# Patient Record
Sex: Female | Born: 1950
Health system: Southern US, Community
[De-identification: ages and names within clinical notes are randomized; demographics above are authoritative.]

## PROBLEM LIST (undated history)

## (undated) DIAGNOSIS — I10 Essential (primary) hypertension: Secondary | ICD-10-CM

## (undated) DIAGNOSIS — Z9889 Other specified postprocedural states: Secondary | ICD-10-CM

## (undated) DIAGNOSIS — T4145XA Adverse effect of unspecified anesthetic, initial encounter: Secondary | ICD-10-CM

## (undated) DIAGNOSIS — M8580 Other specified disorders of bone density and structure, unspecified site: Secondary | ICD-10-CM

## (undated) DIAGNOSIS — R200 Anesthesia of skin: Secondary | ICD-10-CM

## (undated) DIAGNOSIS — R202 Paresthesia of skin: Secondary | ICD-10-CM

## (undated) DIAGNOSIS — M1990 Unspecified osteoarthritis, unspecified site: Secondary | ICD-10-CM

## (undated) DIAGNOSIS — T7840XA Allergy, unspecified, initial encounter: Secondary | ICD-10-CM

## (undated) DIAGNOSIS — C801 Malignant (primary) neoplasm, unspecified: Secondary | ICD-10-CM

## (undated) DIAGNOSIS — R112 Nausea with vomiting, unspecified: Secondary | ICD-10-CM

## (undated) HISTORY — PX: KNEE ARTHROSCOPY: SUR90

## (undated) HISTORY — DX: Other specified disorders of bone density and structure, unspecified site: M85.80

## (undated) HISTORY — DX: Anesthesia of skin: R20.0

## (undated) HISTORY — DX: Paresthesia of skin: R20.2

## (undated) HISTORY — DX: Allergy, unspecified, initial encounter: T78.40XA

## (undated) HISTORY — PX: CHOLECYSTECTOMY: SHX55

---

## 1973-09-07 HISTORY — PX: TUBAL LIGATION: SHX77

## 1978-09-07 HISTORY — PX: APPENDECTOMY: SHX54

## 1981-09-07 HISTORY — PX: KNEE SURGERY: SHX244

## 2004-09-07 HISTORY — PX: HAND SURGERY: SHX662

## 2004-11-20 ENCOUNTER — Ambulatory Visit (HOSPITAL_COMMUNITY): Admission: RE | Admit: 2004-11-20 | Discharge: 2004-11-20 | Payer: Self-pay | Admitting: Unknown Physician Specialty

## 2006-03-18 ENCOUNTER — Ambulatory Visit (HOSPITAL_COMMUNITY): Admission: RE | Admit: 2006-03-18 | Discharge: 2006-03-18 | Payer: Self-pay | Admitting: Orthopedic Surgery

## 2008-09-07 HISTORY — PX: KNEE ARTHROSCOPY: SUR90

## 2009-01-25 ENCOUNTER — Ambulatory Visit (HOSPITAL_COMMUNITY): Admission: RE | Admit: 2009-01-25 | Discharge: 2009-01-25 | Payer: Self-pay | Admitting: Family Medicine

## 2013-09-07 DIAGNOSIS — T8859XA Other complications of anesthesia, initial encounter: Secondary | ICD-10-CM

## 2013-09-07 HISTORY — DX: Other complications of anesthesia, initial encounter: T88.59XA

## 2013-09-12 ENCOUNTER — Encounter (HOSPITAL_COMMUNITY): Payer: Self-pay

## 2013-09-13 ENCOUNTER — Encounter (HOSPITAL_COMMUNITY)
Admission: RE | Admit: 2013-09-13 | Discharge: 2013-09-13 | Disposition: A | Discharge status: Home / Self Care | Payer: BC Managed Care – PPO | Source: Ambulatory Visit | Attending: Orthopaedic Surgery | Admitting: Orthopaedic Surgery

## 2013-09-13 ENCOUNTER — Ambulatory Visit (HOSPITAL_COMMUNITY)
Admission: RE | Admit: 2013-09-13 | Discharge: 2013-09-13 | Disposition: A | Discharge status: Home / Self Care | Payer: BC Managed Care – PPO | Source: Ambulatory Visit | Attending: Orthopedic Surgery | Admitting: Orthopedic Surgery

## 2013-09-13 ENCOUNTER — Encounter (HOSPITAL_COMMUNITY): Payer: Self-pay

## 2013-09-13 ENCOUNTER — Other Ambulatory Visit (HOSPITAL_COMMUNITY): Payer: Self-pay

## 2013-09-13 ENCOUNTER — Other Ambulatory Visit (HOSPITAL_COMMUNITY): Payer: Self-pay | Admitting: *Deleted

## 2013-09-13 DIAGNOSIS — E669 Obesity, unspecified: Secondary | ICD-10-CM | POA: Insufficient documentation

## 2013-09-13 DIAGNOSIS — Z87891 Personal history of nicotine dependence: Secondary | ICD-10-CM | POA: Insufficient documentation

## 2013-09-13 DIAGNOSIS — Z01818 Encounter for other preprocedural examination: Secondary | ICD-10-CM | POA: Insufficient documentation

## 2013-09-13 HISTORY — DX: Unspecified osteoarthritis, unspecified site: M19.90

## 2013-09-13 HISTORY — DX: Malignant (primary) neoplasm, unspecified: C80.1

## 2013-09-13 HISTORY — DX: Other specified postprocedural states: Z98.890

## 2013-09-13 HISTORY — DX: Nausea with vomiting, unspecified: R11.2

## 2013-09-13 LAB — COMPREHENSIVE METABOLIC PANEL
ALK PHOS: 89 U/L (ref 39–117)
ALT: 55 U/L — AB (ref 0–35)
AST: 33 U/L (ref 0–37)
Albumin: 4 g/dL (ref 3.5–5.2)
BILIRUBIN TOTAL: 0.3 mg/dL (ref 0.3–1.2)
BUN: 19 mg/dL (ref 6–23)
CHLORIDE: 105 meq/L (ref 96–112)
CO2: 27 mEq/L (ref 19–32)
Calcium: 9.6 mg/dL (ref 8.4–10.5)
Creatinine, Ser: 0.69 mg/dL (ref 0.50–1.10)
GFR calc non Af Amer: >90 mL/min (ref >90)
Glucose, Bld: 92 mg/dL (ref 70–99)
Potassium: 4.7 mEq/L (ref 3.7–5.3)
SODIUM: 144 meq/L (ref 137–147)
TOTAL PROTEIN: 8 g/dL (ref 6.0–8.3)

## 2013-09-13 LAB — URINALYSIS, ROUTINE W REFLEX MICROSCOPIC
Bilirubin Urine: NEGATIVE
Glucose, UA: NEGATIVE mg/dL
Ketones, ur: 15 mg/dL — AB
Nitrite: NEGATIVE
Protein, ur: NEGATIVE mg/dL
SPECIFIC GRAVITY, URINE: 1.029 (ref 1.005–1.030)
Urobilinogen, UA: 0.2 mg/dL (ref 0.0–1.0)
pH: 5.5 (ref 5.0–8.0)

## 2013-09-13 LAB — CBC WITH DIFFERENTIAL/PLATELET
Basophils Absolute: 0 10*3/uL (ref 0.0–0.1)
Basophils Relative: 1 % (ref 0–1)
Eosinophils Absolute: 0.1 10*3/uL (ref 0.0–0.7)
Eosinophils Relative: 2 % (ref 0–5)
HCT: 37.2 % (ref 36.0–46.0)
HEMOGLOBIN: 13.5 g/dL (ref 12.0–15.0)
LYMPHS PCT: 44 % (ref 12–46)
Lymphs Abs: 3.6 10*3/uL (ref 0.7–4.0)
MCH: 32.8 pg (ref 26.0–34.0)
MCHC: 36.3 g/dL — ABNORMAL HIGH (ref 30.0–36.0)
MCV: 90.5 fL (ref 78.0–100.0)
Monocytes Absolute: 0.7 10*3/uL (ref 0.1–1.0)
Monocytes Relative: 9 % (ref 3–12)
NEUTROS PCT: 46 % (ref 43–77)
Neutro Abs: 3.8 10*3/uL (ref 1.7–7.7)
Platelets: 236 10*3/uL (ref 150–400)
RBC: 4.11 MIL/uL (ref 3.87–5.11)
RDW: 13.3 % (ref 11.5–15.5)
WBC: 8.3 10*3/uL (ref 4.0–10.5)

## 2013-09-13 LAB — URINE MICROSCOPIC-ADD ON

## 2013-09-13 LAB — TYPE AND SCREEN
ABO/RH(D): O POS
ANTIBODY SCREEN: NEGATIVE

## 2013-09-13 LAB — APTT: aPTT: 31 seconds (ref 24–37)

## 2013-09-13 LAB — SURGICAL PCR SCREEN
MRSA, PCR: NEGATIVE
Staphylococcus aureus: NEGATIVE

## 2013-09-13 LAB — PROTIME-INR
INR: 1.03 (ref 0.00–1.49)
Prothrombin Time: 13.3 seconds (ref 11.6–15.2)

## 2013-09-13 LAB — ABO/RH: ABO/RH(D): O POS

## 2013-09-13 NOTE — Pre-Procedure Instructions (Signed)
[name]  09/13/2013   Your procedure is scheduled on:  09/19/13  Report to [facility] short stay admitting at 530 AM.  Call this number if you have problems the morning of surgery: [phone]   Remember:   Do not eat food or drink liquids after midnight.   Take these medicines the morning of surgery with A SIP OF WATER: none   Do not wear jewelry, make-up or nail polish.  Do not wear lotions, powders, or perfumes. You may wear deodorant.  Do not shave 48 hours prior to surgery. Men may shave face and neck.  Do not bring valuables to the hospital.  [facility] is not responsible                  for any belongings or valuables.               Contacts, dentures or bridgework may not be worn into surgery.  Leave suitcase in the car. After surgery it may be brought to your room.  For patients admitted to the hospital, discharge time is determined by your                treatment team.               Patients discharged the day of surgery will not be allowed to drive  home.  Name and phone number of your driver:   Special Instructions: Incentive Spirometry - Practice and bring it with you on the day of surgery. Shower using CHG 2 nights before surgery and the night before surgery.  If you shower the day of surgery use CHG.  Use special wash - you have one bottle of CHG for all showers.  You should use approximately 1/3 of the bottle for each shower.   Please read over the following fact sheets that you were given: Pain Booklet, Coughing and Deep Breathing, Blood Transfusion Information, MRSA Information and Surgical Site Infection Prevention

## 2013-09-13 NOTE — Progress Notes (Signed)
req'd notes,ekg from dr terry daniel [facility]

## 2013-09-14 ENCOUNTER — Encounter (HOSPITAL_COMMUNITY): Payer: Self-pay

## 2013-09-14 NOTE — Progress Notes (Addendum)
Anesthesia Chart Review:  Patient is a 63 year old female scheduled for left TKA on 09/19/13 by Dr. [name].  History includes former smoker, obesity, arthritis, melanoma, appendectomy, post-operative N/V.  PCP is Dr. Gar Ponto at [facility] in [place].    EKG on 09/13/13 showed NSR, ST/T wave abnormality, consider lateral ischemia. No previous EKG at her PCP office or at [facility].   CXR on 09/13/13 showed no active cardiopulmonary disease.  Preoperative labs noted.  Urine culture showed > 100,000 gram negative rods.  I reviewed currently available EKG with anesthesiologist Dr. Tamala Julian.  Patient with obesity, otherwise no reported HTN, CAD/MI/CHF, or DM.  If no comparison EKG is found and otherwise she is asymptomatic from a CV standpoint then he anticipates that she can proceed as planned.  I have left a message for patient to call me to clinically correlate.  In the meantime, I notified Dr. Rudene Anda PA Aaron Edelman regarding EKG and urine culture findings.  He saw her for her H&P, and she denied any CV symptoms at that time.  He did note that she does have a family history of CAD, so he is going to call Dr. Quillian Quince to discuss if feels patient will need further evaluation preoperatively.  I will fax preoperative results to [facility].  George Hugh [facility]/Anesthesiology Phone [phone] 09/15/2013 11:56 AM  Addendum: 09/18/13 1:30 PM I spoke with Aaron Edelman for update.  He received a message from Dr. Einar Gip stating that patient had a low risk stress test today.  I'll request records from his office.

## 2013-09-15 NOTE — H&P (Signed)
CHIEF COMPLAINT:  Painful left knee.   HISTORY OF PRESENT ILLNESS:  [name] is a very pleasant 63 year old white female who is seen today for evaluation of her left knee.  She has had an extended history of problems with the left knee dating back into the early 1980s.  She has had 3 surgeries on the left knee, one in 1982, that of an arthroscopy, one in 1983 for ligamentous repair probably of the patellofemoral joint and 1984 when she had a knee scope on the left for debridement.  She has had multiple injections over the years.  She has had recent cortisone injections, which make the knee feel just a little bit better, but she is now to the point where she would like to consider further evaluation for total joint replacements.  She is now having pain with every step and pain at night.  She has pain with her activities of daily living.  She has decreased range of motion and inability to fully extend the knee is also noted.  She is seen today for evaluation.     PAST SURGICAL HISTORY:   1.  Surgery in 1975 for tubal ligation.  2.  Surgery in 1980 for appendectomy. 3.  Surgery in 1982, 1983 and 1984 left knee surgery.  4.  Surgery in 2001 for removal of foreign object from the hand. 5.  Surgery in 2010 for right knee scope.    CURRENT MEDICATIONS:  1.  Advil 200 mg 3 tablets in the morning.  2.  Benadryl 25 mg as needed. 3.  Allegra 180 mg as needed. 4.  Tretinoin 0.05 cream daily to the face for eczema.     ALLERGIES:  Percocet, Vicodin and Darvocet, which cause her sickness to her stomach and anxiety.  She also has some difficulty with anesthesia.     REVIEW OF SYSTEMS:  A 14 point review of systems is negative except for melanoma in 2006 for the lower back.  No recurrence.  She also has chronic hematuria and has been evaluated by a urology.     FAMILY HISTORY:  Positive for a mother who died at age 60 from heart disease and her father who died at age 22 from lung cancer.  She has a brother who  died at age 34 from congestive heart failure, but there is one that is living, but does have diabetes.  Sisters, she has two die, one at age 45 from cardiac issues and one at 14 from lung cancer.  She has 2 living, ages 72 and 42.     SOCIAL HISTORY:  She smoked 2 packs of cigarettes per day for 24 years and quit in 1994.  She does drink an occasional glass of wine.     PHYSICAL EXAMINATION:  Today reveals a 63 year old white female, well developed, well nourished, alert, cooperative in moderate distress secondary to left knee pain.  Height is 5 feet and weight is 201 pounds.  BMI is 39.3.   Vital signs:  Temperature 96.9, pulse 78, respiration 12, blood pressure 149/76.   Head is normocephalic.  Eyes:  Pupils equal, round and reactive to light and accommodation with extraocular movements are intact.  Ears, nose and throat were benign.   The neck was supple and no bruits were noted.   Chest had good expansion.  Lungs were essentially clear.  Cardiac:  She had a regular rhythm and rate.  Normal S1 and S2.  No discrete murmurs, rubs or gallops appreciated.  Pulses were  1+ bilateral and symmetric in the lower extremities.  Abdomen was scaphoid and soft, nontender.  No masses palpable.  Normal bowel sounds present.   CNS:  She is oriented x3 and cranial nerves II through XII grossly intact.   Musculoskeletal:  She lacks about 3 degrees to 5 degrees of full extension.  Further flexion to about 95 degrees.  She does have some pseudolaxity with varus and valgus stressing.  She is otherwise ligamentously stable.  Neurovascularly intact distally.     CLINICAL IMPRESSION:  End-stage OA documented previously of the left knee.    RECOMMENDATIONS:  At this time we will plan on proceeding with a left total knee arthroplasty.  The procedure, risks and benefits were fully explained and she is understanding and would like to proceed.    Mike Craze [place], [place] [phone]  09/15/2013 12:16 PM

## 2013-09-16 LAB — URINE CULTURE

## 2013-09-18 MED ORDER — DEXTROSE 5 % IV SOLN
3.0000 g | INTRAVENOUS | Status: AC
  Administered 2013-09-19: 3 g via INTRAVENOUS
  Filled 2013-09-18 (×2): qty 3000

## 2013-09-18 MED ORDER — CHLORHEXIDINE GLUCONATE 4 % EX LIQD: 60.0000 mL | Freq: Every day | CUTANEOUS | Status: DC

## 2013-09-18 MED ORDER — ACETAMINOPHEN 500 MG PO TABS
1000.0000 mg | ORAL_TABLET | Freq: Once | ORAL | Status: AC
  Administered 2013-09-19: 1000 mg via ORAL

## 2013-09-18 MED ORDER — CHLORHEXIDINE GLUCONATE 4 % EX LIQD: 60.0000 mL | Freq: Once | CUTANEOUS | Status: DC

## 2013-09-19 ENCOUNTER — Inpatient Hospital Stay (HOSPITAL_COMMUNITY)
Admission: RE | Admit: 2013-09-19 | Discharge: 2013-09-21 | DRG: 470 | Disposition: A | Discharge status: Home Health | Payer: BC Managed Care – PPO | Source: Ambulatory Visit | Attending: Orthopaedic Surgery | Admitting: Orthopaedic Surgery

## 2013-09-19 ENCOUNTER — Inpatient Hospital Stay (HOSPITAL_COMMUNITY): Payer: BC Managed Care – PPO | Admitting: Anesthesiology

## 2013-09-19 ENCOUNTER — Encounter (HOSPITAL_COMMUNITY): Payer: BC Managed Care – PPO | Admitting: Vascular Surgery

## 2013-09-19 ENCOUNTER — Encounter (HOSPITAL_COMMUNITY): Payer: Self-pay | Admitting: *Deleted

## 2013-09-19 ENCOUNTER — Encounter (HOSPITAL_COMMUNITY)
Admission: RE | Disposition: A | Discharge status: Home Health | Payer: BC Managed Care – PPO | Source: Ambulatory Visit | Attending: Orthopaedic Surgery

## 2013-09-19 DIAGNOSIS — E669 Obesity, unspecified: Secondary | ICD-10-CM | POA: Diagnosis present

## 2013-09-19 DIAGNOSIS — Z96659 Presence of unspecified artificial knee joint: Secondary | ICD-10-CM

## 2013-09-19 DIAGNOSIS — Z87891 Personal history of nicotine dependence: Secondary | ICD-10-CM

## 2013-09-19 DIAGNOSIS — M1712 Unilateral primary osteoarthritis, left knee: Secondary | ICD-10-CM

## 2013-09-19 DIAGNOSIS — M1710 Unilateral primary osteoarthritis, unspecified knee: Principal | ICD-10-CM | POA: Diagnosis present

## 2013-09-19 DIAGNOSIS — M659 Unspecified synovitis and tenosynovitis, unspecified site: Secondary | ICD-10-CM | POA: Diagnosis present

## 2013-09-19 DIAGNOSIS — Z6839 Body mass index (BMI) 39.0-39.9, adult: Secondary | ICD-10-CM

## 2013-09-19 HISTORY — PX: TOTAL KNEE ARTHROPLASTY: SHX125

## 2013-09-19 SURGERY — ARTHROPLASTY, KNEE, TOTAL
Anesthesia: Regional | Site: Knee | Laterality: Left

## 2013-09-19 MED ORDER — FENTANYL CITRATE 0.05 MG/ML IJ SOLN
INTRAMUSCULAR | Status: DC | PRN
  Administered 2013-09-19 (×5): 50 ug via INTRAVENOUS

## 2013-09-19 MED ORDER — OXYCODONE HCL 5 MG PO TABS
ORAL_TABLET | ORAL | Status: AC
  Filled 2013-09-19: qty 1

## 2013-09-19 MED ORDER — ONDANSETRON HCL 4 MG/2ML IJ SOLN
INTRAMUSCULAR | Status: DC | PRN
  Administered 2013-09-19: 4 mg via INTRAVENOUS

## 2013-09-19 MED ORDER — HYDROMORPHONE HCL PF 1 MG/ML IJ SOLN
0.5000 mg | INTRAMUSCULAR | Status: DC | PRN
  Administered 2013-09-20: 0.5 mg via INTRAVENOUS
  Filled 2013-09-19: qty 1

## 2013-09-19 MED ORDER — SCOPOLAMINE 1 MG/3DAYS TD PT72
MEDICATED_PATCH | TRANSDERMAL | Status: AC
  Administered 2013-09-19: 1.5 mg via TRANSDERMAL
  Filled 2013-09-19: qty 1

## 2013-09-19 MED ORDER — PROPOFOL 10 MG/ML IV BOLUS
INTRAVENOUS | Status: DC | PRN
  Administered 2013-09-19: 290 mg via INTRAVENOUS

## 2013-09-19 MED ORDER — MAGNESIUM HYDROXIDE 400 MG/5ML PO SUSP: 30.0000 mL | Freq: Every day | ORAL | Status: DC | PRN

## 2013-09-19 MED ORDER — LIDOCAINE HCL (CARDIAC) 20 MG/ML IV SOLN
INTRAVENOUS | Status: DC | PRN
  Administered 2013-09-19: 60 mg via INTRAVENOUS

## 2013-09-19 MED ORDER — METOCLOPRAMIDE HCL 5 MG/ML IJ SOLN: 5.0000 mg | Freq: Three times a day (TID) | INTRAMUSCULAR | Status: DC | PRN

## 2013-09-19 MED ORDER — ALUM & MAG HYDROXIDE-SIMETH 200-200-20 MG/5ML PO SUSP: 30.0000 mL | ORAL | Status: DC | PRN

## 2013-09-19 MED ORDER — ACETAMINOPHEN 325 MG PO TABS: 650.0000 mg | ORAL_TABLET | Freq: Four times a day (QID) | ORAL | Status: DC | PRN

## 2013-09-19 MED ORDER — LACTATED RINGERS IV SOLN
INTRAVENOUS | Status: DC | PRN
  Administered 2013-09-19 (×2): via INTRAVENOUS

## 2013-09-19 MED ORDER — DEXAMETHASONE SODIUM PHOSPHATE 4 MG/ML IJ SOLN
INTRAMUSCULAR | Status: DC | PRN
  Administered 2013-09-19: 4 mg via INTRAVENOUS

## 2013-09-19 MED ORDER — FLEET ENEMA 7-19 GM/118ML RE ENEM: 1.0000 | ENEMA | Freq: Once | RECTAL | Status: AC | PRN

## 2013-09-19 MED ORDER — METHOCARBAMOL 100 MG/ML IJ SOLN
500.0000 mg | Freq: Four times a day (QID) | INTRAMUSCULAR | Status: DC | PRN
  Filled 2013-09-19: qty 5

## 2013-09-19 MED ORDER — LORATADINE 10 MG PO TABS
10.0000 mg | ORAL_TABLET | Freq: Every day | ORAL | Status: DC
  Filled 2013-09-19 (×3): qty 1

## 2013-09-19 MED ORDER — ONDANSETRON HCL 4 MG/2ML IJ SOLN: 4.0000 mg | Freq: Once | INTRAMUSCULAR | Status: DC | PRN

## 2013-09-19 MED ORDER — MENTHOL 3 MG MT LOZG
1.0000 | LOZENGE | OROMUCOSAL | Status: DC | PRN
Start: 2013-09-19 — End: 2013-09-21
  Administered 2013-09-20: 3 mg via ORAL
  Filled 2013-09-19: qty 9

## 2013-09-19 MED ORDER — RIVAROXABAN 10 MG PO TABS
10.0000 mg | ORAL_TABLET | ORAL | Status: DC
  Administered 2013-09-20 – 2013-09-21 (×2): 10 mg via ORAL
  Filled 2013-09-19 (×3): qty 1

## 2013-09-19 MED ORDER — PHENOL 1.4 % MT LIQD: 1.0000 | OROMUCOSAL | Status: DC | PRN

## 2013-09-19 MED ORDER — HYDROMORPHONE HCL PF 1 MG/ML IJ SOLN
0.2500 mg | INTRAMUSCULAR | Status: DC | PRN
  Administered 2013-09-19 (×3): 0.5 mg via INTRAVENOUS

## 2013-09-19 MED ORDER — ONDANSETRON HCL 4 MG/2ML IJ SOLN: 4.0000 mg | Freq: Four times a day (QID) | INTRAMUSCULAR | Status: DC | PRN

## 2013-09-19 MED ORDER — BUPIVACAINE-EPINEPHRINE 0.25% -1:200000 IJ SOLN
INTRAMUSCULAR | Status: DC | PRN
  Administered 2013-09-19: 30 mL

## 2013-09-19 MED ORDER — METOPROLOL TARTRATE 25 MG PO TABS
25.0000 mg | ORAL_TABLET | Freq: Two times a day (BID) | ORAL | Status: DC
  Administered 2013-09-21: 25 mg via ORAL
  Filled 2013-09-19 (×5): qty 1

## 2013-09-19 MED ORDER — OXYCODONE HCL 5 MG/5ML PO SOLN: 5.0000 mg | Freq: Once | ORAL | Status: AC | PRN

## 2013-09-19 MED ORDER — ACETAMINOPHEN 500 MG PO TABS
ORAL_TABLET | ORAL | Status: AC
  Filled 2013-09-19: qty 2

## 2013-09-19 MED ORDER — ACETAMINOPHEN 10 MG/ML IV SOLN
INTRAVENOUS | Status: DC | PRN
  Administered 2013-09-19: 1000 mg via INTRAVENOUS

## 2013-09-19 MED ORDER — TRETINOIN 0.05 % EX CREA: 1.0000 "application " | TOPICAL_CREAM | CUTANEOUS | Status: DC

## 2013-09-19 MED ORDER — METOCLOPRAMIDE HCL 5 MG PO TABS
5.0000 mg | ORAL_TABLET | Freq: Three times a day (TID) | ORAL | Status: DC | PRN
  Filled 2013-09-19: qty 2

## 2013-09-19 MED ORDER — ONDANSETRON HCL 4 MG PO TABS: 4.0000 mg | ORAL_TABLET | Freq: Four times a day (QID) | ORAL | Status: DC | PRN

## 2013-09-19 MED ORDER — MIDAZOLAM HCL 5 MG/5ML IJ SOLN
INTRAMUSCULAR | Status: DC | PRN
  Administered 2013-09-19 (×2): 1 mg via INTRAVENOUS

## 2013-09-19 MED ORDER — BUPIVACAINE-EPINEPHRINE PF 0.5-1:200000 % IJ SOLN
INTRAMUSCULAR | Status: DC | PRN
  Administered 2013-09-19: 30 mL via PERINEURAL

## 2013-09-19 MED ORDER — HYDROMORPHONE HCL PF 1 MG/ML IJ SOLN
INTRAMUSCULAR | Status: AC
  Filled 2013-09-19: qty 1

## 2013-09-19 MED ORDER — BISACODYL 10 MG RE SUPP: 10.0000 mg | Freq: Every day | RECTAL | Status: DC | PRN

## 2013-09-19 MED ORDER — METHOCARBAMOL 500 MG PO TABS
500.0000 mg | ORAL_TABLET | Freq: Four times a day (QID) | ORAL | Status: DC | PRN
  Administered 2013-09-19 – 2013-09-21 (×7): 500 mg via ORAL
  Filled 2013-09-19 (×6): qty 1

## 2013-09-19 MED ORDER — ACETAMINOPHEN 650 MG RE SUPP: 650.0000 mg | Freq: Four times a day (QID) | RECTAL | Status: DC | PRN

## 2013-09-19 MED ORDER — METHOCARBAMOL 500 MG PO TABS
ORAL_TABLET | ORAL | Status: AC
  Filled 2013-09-19: qty 1

## 2013-09-19 MED ORDER — SODIUM CHLORIDE 0.9 % IR SOLN
Status: DC | PRN
  Administered 2013-09-19 (×2): 1000 mL

## 2013-09-19 MED ORDER — OXYCODONE HCL 5 MG PO TABS
5.0000 mg | ORAL_TABLET | ORAL | Status: DC | PRN
Start: 2013-09-19 — End: 2013-09-21
  Administered 2013-09-19: 10 mg via ORAL
  Administered 2013-09-19: 5 mg via ORAL
  Administered 2013-09-20 – 2013-09-21 (×8): 10 mg via ORAL
  Filled 2013-09-19 (×10): qty 2

## 2013-09-19 MED ORDER — ARTIFICIAL TEARS OP OINT
TOPICAL_OINTMENT | OPHTHALMIC | Status: DC | PRN
  Administered 2013-09-19: 1 via OPHTHALMIC

## 2013-09-19 MED ORDER — CEFAZOLIN SODIUM-DEXTROSE 2-3 GM-% IV SOLR
2.0000 g | Freq: Four times a day (QID) | INTRAVENOUS | Status: AC
  Administered 2013-09-19 (×2): 2 g via INTRAVENOUS
  Filled 2013-09-19 (×2): qty 50

## 2013-09-19 MED ORDER — DOCUSATE SODIUM 100 MG PO CAPS
100.0000 mg | ORAL_CAPSULE | Freq: Two times a day (BID) | ORAL | Status: DC
  Administered 2013-09-19 – 2013-09-21 (×4): 100 mg via ORAL
  Filled 2013-09-19 (×6): qty 1

## 2013-09-19 MED ORDER — ACETAMINOPHEN 10 MG/ML IV SOLN
INTRAVENOUS | Status: AC
Start: 2013-09-19 — End: 2013-09-19
  Filled 2013-09-19: qty 100

## 2013-09-19 MED ORDER — CIPROFLOXACIN HCL 250 MG PO TABS
250.0000 mg | ORAL_TABLET | Freq: Two times a day (BID) | ORAL | Status: DC
  Administered 2013-09-19 – 2013-09-21 (×4): 250 mg via ORAL
  Filled 2013-09-19 (×5): qty 1

## 2013-09-19 MED ORDER — OXYCODONE HCL 5 MG PO TABS
5.0000 mg | ORAL_TABLET | Freq: Once | ORAL | Status: AC | PRN
  Administered 2013-09-19: 5 mg via ORAL

## 2013-09-19 MED ORDER — DIPHENHYDRAMINE HCL 25 MG PO CAPS: 25.0000 mg | ORAL_CAPSULE | Freq: Every day | ORAL | Status: DC | PRN

## 2013-09-19 MED ORDER — SODIUM CHLORIDE 0.9 % IV SOLN
75.0000 mL/h | INTRAVENOUS | Status: DC
  Administered 2013-09-19 – 2013-09-20 (×2): 75 mL/h via INTRAVENOUS

## 2013-09-19 MED ORDER — SUCCINYLCHOLINE CHLORIDE 20 MG/ML IJ SOLN
INTRAMUSCULAR | Status: DC | PRN
  Administered 2013-09-19: 60 mg via INTRAVENOUS

## 2013-09-19 MED ORDER — SCOPOLAMINE 1 MG/3DAYS TD PT72
1.0000 | MEDICATED_PATCH | Freq: Once | TRANSDERMAL | Status: AC
  Administered 2013-09-19: 1.5 mg via TRANSDERMAL
  Administered 2013-09-19: 1 via TRANSDERMAL

## 2013-09-19 MED ORDER — KETOROLAC TROMETHAMINE 15 MG/ML IJ SOLN
15.0000 mg | Freq: Four times a day (QID) | INTRAMUSCULAR | Status: AC
  Administered 2013-09-19 (×2): 15 mg via INTRAVENOUS
  Filled 2013-09-19 (×2): qty 1

## 2013-09-19 MED ORDER — BUPIVACAINE-EPINEPHRINE (PF) 0.25% -1:200000 IJ SOLN
INTRAMUSCULAR | Status: AC
  Filled 2013-09-19: qty 30

## 2013-09-19 SURGICAL SUPPLY — 60 items
BANDAGE ESMARK 6X9 LF (GAUZE/BANDAGES/DRESSINGS) ×1 IMPLANT
BLADE SAGITTAL 25.0X1.19X90 (BLADE) ×2 IMPLANT
BNDG CMPR 9X6 STRL LF SNTH (GAUZE/BANDAGES/DRESSINGS) ×1
BNDG ESMARK 6X9 LF (GAUZE/BANDAGES/DRESSINGS) ×2
BOWL SMART MIX CTS (DISPOSABLE) ×2 IMPLANT
CAPT RP KNEE ×1 IMPLANT
CEMENT HV SMART SET (Cement) ×4 IMPLANT
CLOTH BEACON ORANGE TIMEOUT ST (SAFETY) ×2 IMPLANT
COVER BACK TABLE 24X17X13 BIG (DRAPES) ×2 IMPLANT
COVER SURGICAL LIGHT HANDLE (MISCELLANEOUS) ×2 IMPLANT
CUFF TOURNIQUET SINGLE 34IN LL (TOURNIQUET CUFF) ×1 IMPLANT
CUFF TOURNIQUET SINGLE 44IN (TOURNIQUET CUFF) IMPLANT
DRAPE EXTREMITY T 121X128X90 (DRAPE) ×2 IMPLANT
DRAPE PROXIMA HALF (DRAPES) ×2 IMPLANT
DRSG ADAPTIC 3X8 NADH LF (GAUZE/BANDAGES/DRESSINGS) ×2 IMPLANT
DRSG PAD ABDOMINAL 8X10 ST (GAUZE/BANDAGES/DRESSINGS) ×4 IMPLANT
DURAPREP 26ML APPLICATOR (WOUND CARE) ×2 IMPLANT
ELECT CAUTERY BLADE 6.4 (BLADE) ×2 IMPLANT
ELECT REM PT RETURN 9FT ADLT (ELECTROSURGICAL) ×2
ELECTRODE REM PT RTRN 9FT ADLT (ELECTROSURGICAL) ×1 IMPLANT
EVACUATOR 1/8 PVC DRAIN (DRAIN) ×1 IMPLANT
FACESHIELD LNG OPTICON STERILE (SAFETY) ×4 IMPLANT
FLOSEAL 10ML (HEMOSTASIS) IMPLANT
GLOVE BIOGEL PI IND STRL 8 (GLOVE) ×1 IMPLANT
GLOVE BIOGEL PI IND STRL 8.5 (GLOVE) ×1 IMPLANT
GLOVE BIOGEL PI INDICATOR 8 (GLOVE) ×1
GLOVE BIOGEL PI INDICATOR 8.5 (GLOVE) ×1
GLOVE ECLIPSE 8.0 STRL XLNG CF (GLOVE) ×4 IMPLANT
GLOVE SURG ORTHO 8.5 STRL (GLOVE) ×4 IMPLANT
GOWN STRL NON-REIN LRG LVL3 (GOWN DISPOSABLE) ×4 IMPLANT
GOWN STRL REUS W/TWL 2XL LVL3 (GOWN DISPOSABLE) ×2 IMPLANT
HANDPIECE INTERPULSE COAX TIP (DISPOSABLE) ×2
KIT BASIN OR (CUSTOM PROCEDURE TRAY) ×2 IMPLANT
KIT ROOM TURNOVER OR (KITS) ×2 IMPLANT
MANIFOLD NEPTUNE II (INSTRUMENTS) ×2 IMPLANT
NEEDLE 22X1 1/2 (OR ONLY) (NEEDLE) IMPLANT
NS IRRIG 1000ML POUR BTL (IV SOLUTION) ×2 IMPLANT
PACK TOTAL JOINT (CUSTOM PROCEDURE TRAY) ×2 IMPLANT
PAD ABD 8X10 STRL (GAUZE/BANDAGES/DRESSINGS) ×1 IMPLANT
PAD ARMBOARD 7.5X6 YLW CONV (MISCELLANEOUS) ×4 IMPLANT
PAD CAST 4YDX4 CTTN HI CHSV (CAST SUPPLIES) ×1 IMPLANT
PADDING CAST COTTON 4X4 STRL (CAST SUPPLIES) ×2
PADDING CAST COTTON 6X4 STRL (CAST SUPPLIES) ×2 IMPLANT
SET HNDPC FAN SPRY TIP SCT (DISPOSABLE) ×1 IMPLANT
SPONGE GAUZE 4X4 12PLY (GAUZE/BANDAGES/DRESSINGS) ×2 IMPLANT
STAPLER VISISTAT 35W (STAPLE) ×2 IMPLANT
SUCTION FRAZIER TIP 10 FR DISP (SUCTIONS) ×2 IMPLANT
SUT BONE WAX W31G (SUTURE) ×2 IMPLANT
SUT ETHIBOND NAB CT1 #1 30IN (SUTURE) ×6 IMPLANT
SUT MNCRL AB 3-0 PS2 18 (SUTURE) ×2 IMPLANT
SUT VIC AB 0 CT1 27 (SUTURE) ×2
SUT VIC AB 0 CT1 27XBRD ANBCTR (SUTURE) ×1 IMPLANT
SUT VIC AB 1 CT1 27 (SUTURE) ×2
SUT VIC AB 1 CT1 27XBRD ANBCTR (SUTURE) ×1 IMPLANT
SYR CONTROL 10ML LL (SYRINGE) IMPLANT
TOWEL OR 17X24 6PK STRL BLUE (TOWEL DISPOSABLE) ×2 IMPLANT
TOWEL OR 17X26 10 PK STRL BLUE (TOWEL DISPOSABLE) ×2 IMPLANT
TRAY FOLEY CATH 16FRSI W/METER (SET/KITS/TRAYS/PACK) ×1 IMPLANT
WATER STERILE IRR 1000ML POUR (IV SOLUTION) ×3 IMPLANT
WRAP KNEE MAXI GEL POST OP (GAUZE/BANDAGES/DRESSINGS) ×2 IMPLANT

## 2013-09-19 NOTE — Preoperative (Signed)
Beta Blockers   Reason not to administer Beta Blockers:Not Applicable 

## 2013-09-19 NOTE — Anesthesia Procedure Notes (Addendum)
Anesthesia Regional Block:  Femoral nerve block  Pre-Anesthetic Checklist: ,, timeout performed, Correct Patient, Correct Site, Correct Laterality, Correct Procedure, Correct Position, site marked, Risks and benefits discussed,  Surgical consent,  Pre-op evaluation,  At surgeon's request and post-op pain management  Laterality: Left and Lower  Prep: chloraprep       Needles:  Injection technique: Single-shot  Needle Type: Echogenic Needle     Needle Length: 9cm  Needle Gauge: 21 and 21 G    Additional Needles:  Procedures: ultrasound guided (picture in chart) Femoral nerve block Narrative:  Start time: 09/19/2013 7:08 AM End time: 09/19/2013 7:18 AM Injection made incrementally with aspirations every 5 mL.  Performed by: Personally  Anesthesiologist: Lorrene Reid, MD

## 2013-09-19 NOTE — Anesthesia Preprocedure Evaluation (Addendum)
Anesthesia Evaluation  Patient identified by MRN, date of birth, ID band Patient awake    Reviewed: Allergy & Precautions, H&P , NPO status , Patient's Chart, lab work & pertinent test results, reviewed documented beta blocker date and time   History of Anesthesia Complications (+) PONV  Airway Mallampati: II TM Distance: >3 FB Neck ROM: Full    Dental  (+) Teeth Intact and Dental Advisory Given   Pulmonary former smoker,  breath sounds clear to auscultation  Pulmonary exam normal       Cardiovascular Rhythm:Regular Rate:Normal     Neuro/Psych    GI/Hepatic   Endo/Other  Morbid obesity  Renal/GU      Musculoskeletal   Abdominal Normal abdominal exam  (+)   Peds  Hematology   Anesthesia Other Findings cancer  Reproductive/Obstetrics                          Anesthesia Physical Anesthesia Plan  ASA: II  Anesthesia Plan: General and Regional   Post-op Pain Management:    Induction: Intravenous  Airway Management Planned: Oral ETT  Additional Equipment:   Intra-op Plan:   Post-operative Plan: Extubation in OR  Informed Consent: I have reviewed the patients History and Physical, chart, labs and discussed the procedure including the risks, benefits and alternatives for the proposed anesthesia with the patient or authorized representative who has indicated his/her understanding and acceptance.   Dental advisory given  Plan Discussed with: CRNA, Anesthesiologist and Surgeon  Anesthesia Plan Comments:         Anesthesia Quick Evaluation

## 2013-09-19 NOTE — Op Note (Signed)
PATIENT ID:      [name]  MRN:     [id number] DOB/AGE:    11/07/1950 / 63 y.o.       OPERATIVE REPORT    DATE OF PROCEDURE:  09/19/2013       PREOPERATIVE DIAGNOSIS:   LEFT KNEE OSTEOARTHRITIS-END STAGE                                                       There is no weight on file to calculate BMI.     POSTOPERATIVE DIAGNOSIS:   LEFT KNEE OSTEOARTHRITIS -END STAGE                                                                    There is no weight on file to calculate BMI.     PROCEDURE:  Procedure(s): TOTAL KNEE ARTHROPLASTY left     SURGEON:  [name], MD    ASSISTANT:   [name], PA-C   (Present and scrubbed throughout the case, critical for assistance with exposure, retraction, instrumentation, and closure.)          ANESTHESIA: regional and general     DRAINS: (left knee) Hemovact drain(s) in the clamped with  Suction Clamped :      TOURNIQUET TIME:  Total Tourniquet Time Documented: Thigh (Left) - 76 minutes Total: Thigh (Left) - 76 minutes     COMPLICATIONS:  None   CONDITION:  stable  PROCEDURE IN DETAIL: [id number]   [name] W 09/19/2013, 9:26 AM

## 2013-09-19 NOTE — Progress Notes (Signed)
Orthopedic Tech Progress Note Patient Details:  [name] Feb 20, 1951 [id number] CPM applied to Left LE with appropriate settings. OHF applied to bed.  CPM Left Knee CPM Left Knee: On Left Knee Flexion (Degrees): 60 Left Knee Extension (Degrees): 0   Asia R Thompson 09/19/2013, 10:52 AM

## 2013-09-19 NOTE — Op Note (Signed)
[name], [name]             ACCOUNT NO.:  [id number]  MEDICAL RECORD NO.:  [id number]  LOCATION:  [facility]                         FACILITY:  [facility]  PHYSICIAN:  Claude Manges. Kevork Joyce, M.D.DATE OF BIRTH:  [birth year]  DATE OF PROCEDURE:  09/19/2013 DATE OF DISCHARGE:                              OPERATIVE REPORT   PREOPERATIVE DIAGNOSIS:  End-stage osteoarthritis, left knee.  POSTOPERATIVE DIAGNOSIS:  End-stage osteoarthritis, left knee.  PROCEDURE:  Left total knee replacement.  SURGEON:  Claude Manges. Cleophas Dunker, MD  ASSISTANT:  Oris Drone. Petrarca, PA-C.  ANESTHESIA:  General with supplemental femoral nerve block.  COMPLICATIONS:  None.  COMPONENTS:  DePuy LCS medium femoral component.  A #3 rotating keeled tibial tray with a 10-mm polyethylene bridging bearing, 3 PEG metal back rotating patella.  Components were secured with polymethyl methacrylate.  DESCRIPTION OF PROCEDURE:  Mrs. Poole was met in the holding area, identified the left knee as appropriate operative site, and marked it accordingly.  Anesthesia performed a left femoral nerve block.  The patient was then transported to room #7 and placed under general anesthesia without difficulty.  Nursing staff inserted a Foley catheter. Urine was clear.  Time-out was called.  Tourniquet was applied to the left lower extremity and the leg was then prepped with chlorhexidine scrub and DuraPrep.  From the tourniquet to the tips of the toes, sterile draping was performed.  With the extremity still elevated, was Esmarch exsanguinated with a proximal tourniquet 350 mmHg.  A midline longitudinal incision was made, centered about the patella, extending from the superior pouch to tibial tubercle.  Via sharp dissection, incision was carried down to subcutaneous tissue.  The patient had prior surgery approximately 30 years ago and had abundant scar tissue, particularly along the medial aspect of the knee.  With careful dissection,  the first layer of capsule was incised.  There had been a patellar tendon procedure to medialize the patellar tendon.  The tendon was intact.  Medial parapatellar incision was then made with the Bovie.  The joint was entered.  There was minimal clear yellow joint effusion.  The patella was everted to 180 degrees and knee flexed to 90 degrees.  There was a moderate amount of beefy-red synovitis.  A synovectomy was performed.  There are large osteophytes along the medial and lateral femoral condyles, and almost complete absence of articular cartilage along the medial tibial plateau and medial femoral condyle and to a greater extent also laterally.  Osteophytes were removed from the femur, and I measured a medium femoral component.  First, bony cut was then made transversely in the proximal tibia with a 7-degree angle of declination using the external tibial guide.  With each bony cut on the tibia and the femur, I used the external guide to assure appropriate angle of osteotomies.  Subsequent cuts were then made on the femur using the medium femoral jig.  Lamina spreader was inserted in the medial and lateral compartments to remove medial and lateral menisci, ACL and PCL.  A 3/4- inch osteotome was used to remove osteophytes from the posterior femoral condyle medially and laterally.  There was a large prominent fabella in the lateral compartment posteriorly that I removed  very carefully with the Bovie.  ACL and PCL were sacrificed.  MCL and LCL remained intact. A 10-mm flexion and extension gaps were perfectly symmetrical.  There was a preoperative flexion contracture that was corrected postoperatively.  Final cut was then made on the femur using a 4-degree distal femoral valgus cut and then the tapered cuts.  Retractors were then placed around the tibia, it was advanced anteriorly.  We measured a #3 tibial tray.  Center hole was then made followed by the keeled cut.  With the tibial  jig in place, a 10-mm polyethylene bridging bearing was inserted followed by the medium trial femoral component.  The joint was then reduced through full range of motion.  We had perfect stability.  There was no malrotation of the tibial tray.  The flexion contracture was resolved.  Patella was prepared by removing approximately 8 mm of bone, leaving 12- mm patella thickness, three pegs were inserted.  The three holes were then made.  The trial patella was inserted.  Through a full range of motion, it remained perfectly stable.  The trial components were removed.  The joint was copiously irrigated with saline solution.  Final components were impacted with polymethyl methacrylate.  We initially inserted the #3 tibial tray followed by the 10-mm polyethylene bridging bearing and then the medium femoral component.  Knee was placed in full extension after impaction. Extraneous methacrylate removed from the components.  The patella was applied with methacrylate and a patellar clamp.  At approximately 16 minutes of methacrylate had matured, during which time, we injected 0.25% Marcaine with epinephrine at the deep capsule.  The joint was then explored.  It was irrigated.  There was no further loose material. Tourniquet was deflated at 76 minutes.  We had capillary refilled to the joint surface.  Bleeding was controlled with a Bovie.  Bleeding bone was covered with bone wax.  A medium-size Hemovac was inserted through lateral compartment.  We had a nice dry field.  The Hemovac was clamped. The deep capsule was then closed with interrupted #1 Ethibond, superficial capsule was closed with running 0 Vicryl, subcu with 2-0 Vicryl and 3-0 Monocryl.  Skin closed with skin clips.  Sterile bulky dressing was applied followed by the patient's support stocking.  The patient tolerated the procedure without complications.  Technically it was difficult because of the old surgery and scar.  We had  an excellent result.     [name]. [name], M.D.     PWW/MEDQ  D:  09/19/2013  T:  09/19/2013  Job:  [id number]

## 2013-09-19 NOTE — Transfer of Care (Signed)
Immediate Anesthesia Transfer of Care Note  Patient: [name]  Procedure(s) Performed: Procedure(s): TOTAL KNEE ARTHROPLASTY (Left)  Patient Location: PACU  Anesthesia Type:GA combined with regional for post-op pain  Level of Consciousness: awake, alert  and oriented  Airway & Oxygen Therapy: Patient Spontanous Breathing and Patient connected to face mask oxygen  Post-op Assessment: Report given to PACU RN  Post vital signs: Reviewed and stable  Complications: No apparent anesthesia complications

## 2013-09-19 NOTE — Progress Notes (Signed)
Notified Dr. [name] of patient being placed on aspirin 81 mg by Dr. Einar Gip and she last took it yesterday. Dr. [name] stated was fine.

## 2013-09-19 NOTE — Anesthesia Postprocedure Evaluation (Signed)
  Anesthesia Post-op Note  Patient: [name]  Procedure(s) Performed: Procedure(s): TOTAL KNEE ARTHROPLASTY (Left)  Patient Location: PACU  Anesthesia Type:GA combined with regional for post-op pain  Level of Consciousness: awake, alert  and oriented  Airway and Oxygen Therapy: Patient Spontanous Breathing  Post-op Pain: mild  Post-op Assessment: Post-op Vital signs reviewed  Post-op Vital Signs: Reviewed  Complications: No apparent anesthesia complications

## 2013-09-19 NOTE — Progress Notes (Signed)
Utilization review completed.  

## 2013-09-19 NOTE — Plan of Care (Signed)
Problem: Consults Goal: Diagnosis- Total Joint Replacement Primary Total Knee Left     

## 2013-09-19 NOTE — H&P (Signed)
  The recent History & Physical has been reviewed. I have personally examined the patient today. There is no interval change to the documented History & Physical. The patient would like to proceed with the procedure.  [name] W 09/19/2013,  7:10 AM

## 2013-09-20 ENCOUNTER — Encounter (HOSPITAL_COMMUNITY): Payer: Self-pay | Admitting: Orthopaedic Surgery

## 2013-09-20 DIAGNOSIS — M1712 Unilateral primary osteoarthritis, left knee: Secondary | ICD-10-CM | POA: Diagnosis present

## 2013-09-20 DIAGNOSIS — E669 Obesity, unspecified: Secondary | ICD-10-CM | POA: Diagnosis present

## 2013-09-20 LAB — BASIC METABOLIC PANEL
BUN: 12 mg/dL (ref 6–23)
CHLORIDE: 104 meq/L (ref 96–112)
CO2: 26 mEq/L (ref 19–32)
CREATININE: 0.73 mg/dL (ref 0.50–1.10)
Calcium: 8.3 mg/dL — ABNORMAL LOW (ref 8.4–10.5)
GFR calc Af Amer: >90 mL/min (ref >90)
GFR calc non Af Amer: 90 mL/min — ABNORMAL LOW (ref >90)
Glucose, Bld: 112 mg/dL — ABNORMAL HIGH (ref 70–99)
Potassium: 4 mEq/L (ref 3.7–5.3)
Sodium: 140 mEq/L (ref 137–147)

## 2013-09-20 LAB — CBC
HEMATOCRIT: 30.5 % — AB (ref 36.0–46.0)
Hemoglobin: 10.8 g/dL — ABNORMAL LOW (ref 12.0–15.0)
MCH: 32.2 pg (ref 26.0–34.0)
MCHC: 35.4 g/dL (ref 30.0–36.0)
MCV: 91 fL (ref 78.0–100.0)
Platelets: 184 10*3/uL (ref 150–400)
RBC: 3.35 MIL/uL — ABNORMAL LOW (ref 3.87–5.11)
RDW: 13.3 % (ref 11.5–15.5)
WBC: 8.7 10*3/uL (ref 4.0–10.5)

## 2013-09-20 NOTE — Evaluation (Signed)
Occupational Therapy Evaluation Patient Details Name: [name] MRN: [id number] DOB: February 27, 1951 Today's Date: 09/20/2013 Time: 3500-9381 OT Time Calculation (min): 42 min  OT Assessment / Plan / Recommendation History of present illness Pt is a 63 y/o female s/p L TKA   Clinical Impression   Pt presents w/ deficits in her ability to perform functional transfers and ADL tasks s/p LLE TKA. Pt will benefit from acute OT to assist in maximizing independence w/ ADL's and transfers prior to anticipated d/c home w/ husband PRN assist. Note: Pt passed out x2 after sitting EOB this am. Assess BP & transfers next visit.    OT Assessment  Patient needs continued OT Services    Follow Up Recommendations  Home health OT;Supervision/Assistance - 24 hour;Supervision - Intermittent    Barriers to Discharge      Equipment Recommendations  None recommended by OT;Other (comment) (Pt reports DME was ordered prior to her surgery.)    Recommendations for Other Services    Frequency  Min 2X/week    Precautions / Restrictions Precautions Precautions: Knee;Fall Restrictions Weight Bearing Restrictions: Yes LLE Weight Bearing: Partial weight bearing LLE Partial Weight Bearing Percentage or Pounds: 50% LLE   Pertinent Vitals/Pain 5/10 LLE knee pain. RN made aware, brought meds. Prior to giving pt pain meds, pt was noted to pass out while sitting EOB. RN assessed BP (see flow sheet for details) in sitting and lying. OOB transfers/activities deferred, TBA as medically stable.    ADL  Eating/Feeding: Performed;Independent Where Assessed - Eating/Feeding: Edge of bed Grooming: Performed;Wash/dry hands;Wash/dry face;Set up;Modified independent Where Assessed - Grooming: Unsupported sitting Upper Body Bathing: Simulated;Set up Where Assessed - Upper Body Bathing: Supported sitting Lower Body Bathing: Simulated;Minimal assistance Where Assessed - Lower Body Bathing: Supported sitting Upper Body  Dressing: Performed;Set up;Modified independent Where Assessed - Upper Body Dressing: Unsupported sitting Lower Body Dressing: Performed;Moderate assistance (Don/doff socks) Where Assessed - Lower Body Dressing: Supine, head of bed up Toilet Transfer:  (TBA - Pt passed out x2 sitting EOB prior to transfers, RN in room and aware. Notified MD.) Tub/Shower Transfer Method: Not assessed Transfers/Ambulation Related to ADLs: Pt performed bed mobility w/ Min guard assist LLE, advancing off of bed. No sit to stand performed as pt was noted to pass out x2 sitting EOB w/ RN in room. ADL Comments: Pt was educated in role of OT. Discussed ADL's. Pt was able to sit EOB x5 min w/ R UE support on bedrail (waiting for RN medications). While pt was sitting EOB she became light headed & was noted to pass out (prior to meds being given). RN checked BP - see vitals assessment, BP was low and prior to pt being assisted back to bed, she passed out again. Pt was then +2 total assist back to bed and repositioned and MD notified. RN assessed BP lying 150/44. Deferred all OOB activity at this time, monitor BP.    OT Diagnosis: Generalized weakness;Acute pain  OT Problem List: Decreased activity tolerance;Impaired balance (sitting and/or standing);Decreased knowledge of precautions;Decreased knowledge of use of DME or AE;Cardiopulmonary status limiting activity;Pain OT Treatment Interventions: Self-care/ADL training;Energy conservation;DME and/or AE instruction;Patient/family education;Therapeutic activities;Balance training   OT Goals(Current goals can be found in the care plan section) Acute Rehab OT Goals Patient Stated Goal: Get up and move Time For Goal Achievement: 09/27/13 Potential to Achieve Goals: Good  Visit Information  Last OT Received On: 09/20/13 Assistance Needed: +1 Reason Eval/Treat Not Completed: Medical issues which prohibited therapy History of Present  Illness: Pt is a 63 y/o female s/p L TKA        Prior Functioning     Home Living Family/patient expects to be discharged to:: Private residence Living Arrangements: Spouse/significant other Available Help at Discharge: Family;Available 24 hours/day Type of Home: House Home Access: Stairs to enter [employer] of Steps: 2 STE Entrance Stairs-Rails: None Home Layout: One level Home Equipment: Grab bars - tub/shower;Shower seat - built in Prior Function Level of Independence: Independent Communication Communication: No difficulties Dominant Hand: Right    Vision/Perception Vision - History Baseline Vision: Wears glasses only for reading Patient Visual Report: No change from baseline   Cognition  Cognition Arousal/Alertness: Awake/alert Behavior During Therapy: WFL for tasks assessed/performed Overall Cognitive Status: Within Functional Limits for tasks assessed    Extremity/Trunk Assessment Upper Extremity Assessment Upper Extremity Assessment: Overall WFL for tasks assessed Lower Extremity Assessment Lower Extremity Assessment: Defer to PT evaluation    Mobility Bed Mobility Overal bed mobility: Needs Assistance Bed Mobility: Supine to Sit;Sit to Sidelying;Sit to Supine Supine to sit: Supervision;HOB elevated Sit to supine: +2 for physical assistance Sit to sidelying: Min guard;Supervision;HOB elevated General bed mobility comments: Min guard assist LLE off bed for sitting EOB. Back to bed was +2 total assist as pt had passed out. Transfers deferred at this time, monitor BP.        Balance Balance Overall balance assessment: Needs assistance Sitting-balance support: Single extremity supported;Feet supported Sitting balance-Leahy Scale: Fair Sitting balance - Comments: Note pt sat EOB x5 min w/ 1 extremity supported. Pt then passed out x2 and was total assist back to bed. Monitor BP.   End of Session OT - End of Session Equipment Utilized During Treatment: Oxygen Activity Tolerance: Treatment limited  secondary to medical complications (Comment) Patient left: in bed;with call bell/phone within reach;with nursing/sitter in room;with family/visitor present Nurse Communication: Other (comment);Patient requests pain meds (RN aware of low BP prior to passing out sitting EOB, then elevated BP after passing out second time & +2 total assist back to bed.) CPM Left Knee CPM Left Knee: Off  GO     Almyra Deforest 09/20/2013, 10:41 AM

## 2013-09-20 NOTE — Progress Notes (Signed)
Nelson Chimes, PA aware of pt's syncope episode.  Ordered to restart IV at 75 mL/hr for 3 hrs and perform EKG, orders received and carried out.

## 2013-09-20 NOTE — Progress Notes (Signed)
Patient ID: [name], female   DOB: November 07, 1950, 63 y.o.   MRN: [id number] PATIENT ID: [name]        MRN:  [id number]          DOB/AGE: [birth year] / 63 y.o.    [name], MD   [name], PA-C [address] [place], [place]  [address]                             [phone]   PROGRESS NOTE  Subjective:  negative for Chest Pain  negative for Shortness of Breath  negative for Nausea/Vomiting   Had a calf spasm last night.  Better this AM on Robaxin    Tolerating Diet: yes         Patient reports pain as mild and moderate.       Objective: Vital signs in last 24 hours:   Patient Vitals for the past 24 hrs:  BP Temp Temp src Pulse Resp SpO2  09/20/13 0740 - - - - 16 98 %  09/20/13 0635 120/41 mmHg 98.3 F (36.8 C) Oral 76 16 99 %  09/20/13 0400 - - - - 16 98 %  09/20/13 0255 106/53 mmHg 98.8 F (37.1 C) Oral 83 16 96 %  09/20/13 0000 - - - - 16 98 %  09/19/13 2154 124/42 mmHg 98.2 F (36.8 C) Axillary 65 16 95 %  09/19/13 2000 - - - - 16 98 %  09/19/13 1600 - - - - 16 98 %  09/19/13 1441 135/42 mmHg 97.8 F (36.6 C) Oral 66 16 96 %  09/19/13 1415 127/57 mmHg 97.4 F (36.3 C) - 65 18 97 %  09/19/13 1357 133/50 mmHg - - 68 14 99 %  09/19/13 1342 132/55 mmHg - - 65 19 99 %  09/19/13 1327 117/53 mmHg - - 63 14 99 %  09/19/13 1312 110/86 mmHg - - 64 14 99 %  09/19/13 1257 127/55 mmHg - - 63 12 98 %  09/19/13 1242 128/55 mmHg - - 58 18 97 %  09/19/13 1159 137/52 mmHg - - 64 15 98 %  09/19/13 1142 135/55 mmHg - - 60 11 98 %  09/19/13 1127 133/52 mmHg - - 62 22 100 %  09/19/13 1112 143/60 mmHg - - 59 8 99 %  09/19/13 1057 143/79 mmHg - - 63 11 100 %  09/19/13 1042 140/68 mmHg - - 65 14 97 %  09/19/13 1040 - 98 F (36.7 C) - - - -  09/19/13 1030 165/76 mmHg - - 63 10 100 %  09/19/13 1012 160/73 mmHg - - 70 19 92 %  09/19/13 1000 174/75 mmHg - - 75 14 97 %  09/19/13 0958 102/84 mmHg - - - 14 -  09/19/13 0945 - 98.2 F (36.8 C) - 78 14 -      Intake/Output from previous day:   01/13 0701 - 01/14 0700 In: 3888.8 [P.O.:1230; I.V.:2658.8] Out: 2330 [Urine:2250; Drains:30]   Intake/Output this shift:   01/14 0701 - 01/14 1900 In: 105 [I.V.:105] Out: -    Intake/Output     01/13 0701 - 01/14 0700 01/14 0701 - 01/15 0700   P.O. 1230    I.V. 2658.8 105   Total Intake 3888.8 105   Urine 2250    Drains 30    Blood 50    Total Output 2330     Net +1558.8 +105  LABORATORY DATA:  Recent Labs  09/13/13 1533 09/20/13 0545  WBC 8.3 8.7  HGB 13.5 10.8*  HCT 37.2 30.5*  PLT 236 184    Recent Labs  09/13/13 1533 09/20/13 0545  NA 144 140  K 4.7 4.0  CL 105 104  CO2 27 26  BUN 19 12  CREATININE 0.69 0.73  GLUCOSE 92 112*  CALCIUM 9.6 8.3*   Lab Results  Component Value Date   INR 1.03 09/13/2013    Recent Radiographic Studies :  Chest 2 View  09/13/2013   CLINICAL DATA:  Pre-op respiratory exam. Left total knee replacement.  EXAM: CHEST  2 VIEW  COMPARISON:  None.  FINDINGS: The heart size and mediastinal contours are within normal limits. Mild scarring noted in the anterior right upper lobe. Both lungs are otherwise clear. No mass or lymphadenopathy identified. The visualized skeletal structures are unremarkable.  IMPRESSION: No active cardiopulmonary disease.   Electronically Signed   By: Earle Gell M.D.   On: 09/13/2013 16:46     Examination:  General appearance: alert, cooperative, mild distress and moderate distress Resp: clear to auscultation bilaterally Cardio: regular rate and rhythm GI: normal findings: bowel sounds normal  Wound Exam: clean, dry, intact dressing  Drainage:  None: wound tissue dry  Motor Exam: EHL, FHL, Anterior Tibial and Posterior Tibial Intact  Sensory Exam: Superficial Peroneal, Deep Peroneal and Tibial normal  Vascular Exam: Left dorsalis pedis artery has 1+ (weak) pulse  Assessment:    1 Day Post-Op  Procedure(s) (LRB): TOTAL KNEE ARTHROPLASTY  (Left)  ADDITIONAL DIAGNOSIS:  Active Problems:   S/P total knee replacement using cement     Plan: Physical Therapy as ordered Partial Weight Bearing @ 50% (PWB)  DVT Prophylaxis:  Xarelto, Foot Pumps and TED hose  DISCHARGE PLAN: Home  DISCHARGE NEEDS: HHPT, CPM, Walker and 3-in-1 comode seat  Hemovac pulled.  Total of 30 ml since surgery         [facility] 09/20/2013 8:23 AM

## 2013-09-20 NOTE — Progress Notes (Signed)
[name], PA reviewed pt's EKG results, no changes at this time d/t no changes noted from previous EKG.

## 2013-09-20 NOTE — Evaluation (Signed)
Physical Therapy Evaluation Patient Details Name: [name] MRN: [id number] DOB: 1951/03/01 Today's Date: 09/20/2013 Time: 1353-1430 PT Time Calculation (min): 37 min  PT Assessment / Plan / Recommendation History of Present Illness  Pt is a 63 y/o female s/p L TKA  Clinical Impression  Pt is s/p TKA resulting in the deficits listed below (see PT Problem List).  Pt will benefit from skilled PT to increase their independence and safety with mobility to allow discharge to the venue listed below.   Performance on eval limited by postural hypotension; when that clears, pt should progress well     PT Assessment  Patient needs continued PT services    Follow Up Recommendations  Home health PT;Supervision/Assistance - 24 hour    Does the patient have the potential to tolerate intense rehabilitation      Barriers to Discharge        Equipment Recommendations  Rolling walker with 5" wheels;3in1 (PT)    Recommendations for Other Services     Frequency 7X/week    Precautions / Restrictions Precautions Precautions: Knee;Fall Restrictions LLE Weight Bearing: Partial weight bearing LLE Partial Weight Bearing Percentage or Pounds: 50% LLE   Pertinent Vitals/Pain See vitals flow sheet for serial orthostatic BPs Was premedicated for pain      Mobility  Bed Mobility Overal bed mobility: Needs Assistance Bed Mobility: Supine to Sit Supine to sit: Supervision;HOB elevated;+2 for safety/equipment General bed mobility comments: Min guard assist LLE off bed for sitting EOB Transfers Overall transfer level: Needs assistance Equipment used: Rolling walker (2 wheeled) Transfers: Sit to/from Stand Sit to Stand: Min assist;+2 safety/equipment General transfer comment: Cues for tecnhique and safety; Cues also to moniotr activity tolerance during transfers Ambulation/Gait Ambulation/Gait assistance: Min assist Ambulation Distance (Feet): 4 Feet (bed to [facility] to recliner) Assistive  device: Rolling walker (2 wheeled) Gait Pattern/deviations: Step-to pattern General Gait Details: Pivot steps bed to [facility] to recliner; Verbal and demo cues for gait sequence and 50% PWB and to self-monitor for activity tolerance    Exercises Total Joint Exercises Ankle Circles/Pumps: AROM;Both;20 reps Quad Sets: AROM;Left;5 reps Heel Slides: AAROM;Left;5 reps Straight Leg Raises: Left;AAROM (2)   PT Diagnosis: Difficulty walking;Acute pain  PT Problem List: Decreased strength;Decreased range of motion;Decreased activity tolerance;Decreased balance;Decreased mobility;Decreased coordination;Decreased knowledge of use of DME;Pain;Decreased knowledge of precautions (Postural hypotension') PT Treatment Interventions: DME instruction;Gait training;Stair training;Functional mobility training;Therapeutic activities;Therapeutic exercise;Wheelchair mobility training     PT Goals(Current goals can be found in the care plan section) Acute Rehab PT Goals Patient Stated Goal: Get up and move PT Goal Formulation: With patient Time For Goal Achievement: 09/27/13 Potential to Achieve Goals: Good  Visit Information  Last PT Received On: 09/20/13 Assistance Needed: +1 (+2 can be helpful for Orthostatics) History of Present Illness: Pt is a 63 y/o female s/p L TKA       Prior [place] expects to be discharged to:: Private residence Living Arrangements: Spouse/significant other Available Help at Discharge: Family;Available 24 hours/day Type of Home: House Home Access: Stairs to enter [employer] of Steps: 2 STE Entrance Stairs-Rails: None Home Layout: One level Home Equipment: Grab bars - tub/shower;Shower seat - built in Prior Function Level of Independence: Independent Communication Communication: No difficulties Dominant Hand: Right    Cognition  Cognition Arousal/Alertness: Awake/alert Behavior During Therapy: WFL for tasks  assessed/performed Overall Cognitive Status: Within Functional Limits for tasks assessed    Extremity/Trunk Assessment Upper Extremity Assessment Upper Extremity Assessment: Defer to  OT evaluation Lower Extremity Assessment Lower Extremity Assessment: LLE deficits/detail LLE Deficits / Details: Grossly decr AROM and strength postop   Balance    End of Session PT - End of Session Equipment Utilized During Treatment: Gait belt Activity Tolerance: Other (comment) (Limited by dizziness, postural hypotension) Patient left: in chair;with call bell/phone within reach Nurse Communication: Mobility status;Other (comment) (BP drop with upright activity)  GP     Roney Marion [place], [place]  09/20/2013, 3:57 PM

## 2013-09-20 NOTE — Care Management Note (Signed)
CARE MANAGEMENT NOTE 09/20/2013  Patient:  [name], [name]   Account Number:  [id number]  Date Initiated:  09/19/2013  Documentation initiated by:  Ricki Miller  Subjective/Objective Assessment:   63 yr old female s/p left total knee arthroplasty.     Action/Plan:   Case Manager spoke with patient concerning home health and DME needs at discharge.Choice offered.   Anticipated DC Date:  09/21/2013   Anticipated DC Plan:  [place]  CM consult      [facility] Choice  HOME HEALTH  DURABLE MEDICAL EQUIPMENT   Choice offered to / List presented to:  C-1 Patient   DME arranged  3-N-1  [place]  CPM      DME agency  [employer]     [place] arranged  HH-2 PT      [place].   Status of service:  In process, will continue to follow   Comments:  09/20/13  2:59pm Ricki Miller, RN BSN Case Manager Patient wants Raquel Sarna, Physical therapist with [facility]. She informed Case Manager that should she not be available she will refuse [facility]. Case Manager explained that she would forward her request to the [place], Also informed patient that should she refuse [facility] she needs to notify Dr. Dennison Mascot office asap.

## 2013-09-20 NOTE — Progress Notes (Signed)
PT Cancellation Note  Patient Details Name: [name] MRN: [id number] DOB: 1951-07-17   Cancelled Treatment:    Reason Eval/Treat Not Completed: Medical issues which prohibited therapy   Pt just worked with OT, and had a "passing out" type episode sitting EOB  RN, Kenney Houseman informing MD  Will hold Pt at this time, and reattempt PT eval this afternoon  Thanks, [place], [place]    Roney Marion [facility] 09/20/2013, 10:21 AM

## 2013-09-21 LAB — CBC
HEMATOCRIT: 29.8 % — AB (ref 36.0–46.0)
Hemoglobin: 10.6 g/dL — ABNORMAL LOW (ref 12.0–15.0)
MCH: 32.5 pg (ref 26.0–34.0)
MCHC: 35.6 g/dL (ref 30.0–36.0)
MCV: 91.4 fL (ref 78.0–100.0)
Platelets: 180 10*3/uL (ref 150–400)
RBC: 3.26 MIL/uL — ABNORMAL LOW (ref 3.87–5.11)
RDW: 13.3 % (ref 11.5–15.5)
WBC: 11 10*3/uL — ABNORMAL HIGH (ref 4.0–10.5)

## 2013-09-21 LAB — BASIC METABOLIC PANEL
BUN: 7 mg/dL (ref 6–23)
CO2: 24 mEq/L (ref 19–32)
CREATININE: 0.57 mg/dL (ref 0.50–1.10)
Calcium: 8.6 mg/dL (ref 8.4–10.5)
Chloride: 99 mEq/L (ref 96–112)
GFR calc Af Amer: >90 mL/min (ref >90)
GFR calc non Af Amer: >90 mL/min (ref >90)
Glucose, Bld: 133 mg/dL — ABNORMAL HIGH (ref 70–99)
Potassium: 4 mEq/L (ref 3.7–5.3)
Sodium: 136 mEq/L — ABNORMAL LOW (ref 137–147)

## 2013-09-21 MED ORDER — METHOCARBAMOL 500 MG PO TABS
500.0000 mg | ORAL_TABLET | Freq: Three times a day (TID) | ORAL | Status: DC | PRN
Start: 2013-09-21 — End: 2016-09-03

## 2013-09-21 MED ORDER — RIVAROXABAN 10 MG PO TABS: 10.0000 mg | ORAL_TABLET | ORAL | Status: DC

## 2013-09-21 MED ORDER — OXYCODONE HCL 5 MG PO TABS: 5.0000 mg | ORAL_TABLET | ORAL | Status: DC | PRN

## 2013-09-21 MED ORDER — ACETAMINOPHEN 325 MG PO TABS: 650.0000 mg | ORAL_TABLET | Freq: Four times a day (QID) | ORAL | Status: DC | PRN

## 2013-09-21 NOTE — Progress Notes (Signed)
Physical Therapy Treatment Patient Details Name: [name] MRN: [id number] DOB: 12-13-1950 Today's Date: 09/21/2013 Time: 2122-4825 PT Time Calculation (min): 44 min  PT Assessment / Plan / Recommendation  History of Present Illness Pt is a 63 y/o female s/p L TKA   PT Comments   Pt progressing well towards physical therapy goals. Pt with no complaints of dizziness throughout session, and was able to negotiate steps well with assist for walker placement. Pt and husband are both anxious for d/c this afternoon. Gave HEP handout and discussed in detail at end of session.   Follow Up Recommendations  Home health PT;Supervision/Assistance - 24 hour     Does the patient have the potential to tolerate intense rehabilitation     Barriers to Discharge        Equipment Recommendations  Rolling walker with 5" wheels;3in1 (PT)    Recommendations for Other Services    Frequency 7X/week   Progress towards PT Goals Progress towards PT goals: Progressing toward goals  Plan Current plan remains appropriate    Precautions / Restrictions Precautions Precautions: Knee;Fall Restrictions Weight Bearing Restrictions: Yes LLE Weight Bearing: Partial weight bearing LLE Partial Weight Bearing Percentage or Pounds: 50% LLE   Pertinent Vitals/Pain 7/10 during ambulation after steps. Pt positioned for comfort at end of session with towel roll under heel.     Mobility  Bed Mobility General bed mobility comments: Pt received sitting up in recliner Transfers Overall transfer level: Needs assistance Equipment used: Rolling walker (2 wheeled) Transfers: Sit to/from Stand Sit to Stand: Min guard General transfer comment: VC's for hand placement on seated surface.  Ambulation/Gait Ambulation/Gait assistance: Min guard Ambulation Distance (Feet): 35 Feet Assistive device: Rolling walker (2 wheeled) Gait Pattern/deviations: Step-to pattern;Decreased stride length;Narrow base of support Gait  velocity: Decreased Gait velocity interpretation: Below normal speed for age/gender General Gait Details: VC's to maintain PWB status, as well as encouraged step-through gait pattern. Stairs: Yes Stairs assistance: Min assist Stair Management: Backwards;With walker Number of Stairs: 2 General stair comments: Educated pt and husband on safety and sequencing. Assist for walker placement.     Exercises Total Joint Exercises Ankle Circles/Pumps: 10 reps Quad Sets: 10 reps Short Arc Quad: 10 reps Heel Slides: 20 reps Hip ABduction/ADduction: 10 reps Long Arc Quad: 10 reps Goniometric ROM: 72 AAROM   PT Diagnosis:    PT Problem List:   PT Treatment Interventions:     PT Goals (current goals can now be found in the care plan section) Acute Rehab PT Goals Patient Stated Goal: Get up and move PT Goal Formulation: With patient Time For Goal Achievement: 09/27/13 Potential to Achieve Goals: Good  Visit Information  Last PT Received On: 09/21/13 Assistance Needed: +1 History of Present Illness: Pt is a 63 y/o female s/p L TKA    Subjective Data  Patient Stated Goal: Get up and move   Cognition  Cognition Arousal/Alertness: Awake/alert Behavior During Therapy: WFL for tasks assessed/performed Overall Cognitive Status: Within Functional Limits for tasks assessed    Balance     End of Session PT - End of Session Equipment Utilized During Treatment: Gait belt Activity Tolerance: Patient tolerated treatment well Patient left: in chair;with call bell/phone within reach;with family/visitor present Nurse Communication: Mobility status CPM Left Knee CPM Left Knee: On Left Knee Flexion (Degrees): 60 Left Knee Extension (Degrees): 0   GP     Jolyn Lent 09/21/2013, 1:09 PM  Jolyn Lent, [place], DPT [phone]

## 2013-09-21 NOTE — Progress Notes (Signed)
Occupational Therapy Treatment Patient Details Name: [name] MRN: [id number] DOB: 1951/02/13 Today's Date: 09/21/2013 Time: 1856-3149 OT Time Calculation (min): 16 min  OT Assessment / Plan / Recommendation  History of present illness Pt is a 63 y/o female s/p L TKA   OT comments  Pt is aware of AE for LB ADL. Husband will purchase a long sponge and provide assistance for LB dressing as needed.  Reviewed shower transfer, multiple uses of 3 in 1, and transporting objects while using the walker.  Orthostasis has resolved.  Follow Up Recommendations  No OT follow up    Barriers to Discharge       Equipment Recommendations  3 in 1 bedside comode    Recommendations for Other Services    Frequency Min 2X/week   Progress towards OT Goals Progress towards OT goals: Progressing toward goals  Plan Discharge plan needs to be updated    Precautions / Restrictions Precautions Precautions: Knee;Fall Restrictions Weight Bearing Restrictions: Yes LLE Weight Bearing: Partial weight bearing LLE Partial Weight Bearing Percentage or Pounds: 50% LLE   Pertinent Vitals/Pain 4-5/10 L knee, premedicated    ADL  Grooming: Wash/dry hands;Supervision/safety Where Assessed - Grooming: Unsupported standing Toilet Transfer: Min Psychiatric nurse Method: Sit to stand Equipment Used: Gait belt;Rolling walker Transfers/Ambulation Related to ADLs: min guard assist ADL Comments: Educated in benefits of long handled sponge. Husband will assist with LB dressing, does not want AE.  Verbal instruction in technique for stepping in and out of shower at home.  Instructed in multiple uses of 3 in 1 to be delivered to hospital room prior to d/c.    OT Diagnosis:    OT Problem List:   OT Treatment Interventions:     OT Goals(current goals can now be found in the care plan section) Acute Rehab OT Goals Patient Stated Goal: Get up and move  Visit Information  Last OT Received On:  09/21/13 Assistance Needed: +1 History of Present Illness: Pt is a 63 y/o female s/p L TKA    Subjective Data      Prior Functioning       Cognition  Cognition Arousal/Alertness: Awake/alert Behavior During Therapy: WFL for tasks assessed/performed Overall Cognitive Status: Within Functional Limits for tasks assessed    Mobility       Exercises      Balance    End of Session OT - End of Session Activity Tolerance: Patient tolerated treatment well Patient left: Other (comment) (ambulating with PT) CPM Left Knee CPM Left Knee: Off  GO     Malka So 09/21/2013, 10:03 AM [phone]

## 2013-09-21 NOTE — Discharge Planning (Signed)
Clarified with Dr. Irven Shelling office that patient should decrease Lopressor to 1/2 tab twice daily.  Communicated that to pt and wrote same on her dc paperwork.

## 2013-09-21 NOTE — Progress Notes (Signed)
Patient ID: DENEENE TARVER, female   DOB: 06/20/1951, 63 y.o.   MRN: [id number] PATIENT ID: DAWNMARIE BREON        MRN:  [id number]          DOB/AGE: [birth year] / 63 y.o.    [name], MD   [name], PA-C [address] [place], [place]  [address]                             [phone]   PROGRESS NOTE  Subjective:  negative for Chest Pain  negative for Shortness of Breath  negative for Nausea/Vomiting   negative for Calf Pain    Tolerating Diet: yes         Patient reports pain as mild.     Good night and "good day"-anxious for D/C  Objective: Vital signs in last 24 hours:   Patient Vitals for the past 24 hrs:  BP Temp Temp src Pulse Resp SpO2  09/21/13 1029 163/55 mmHg - - 86 - -  09/21/13 0542 139/51 mmHg 99.3 F (37.4 C) Oral 83 18 99 %  09/20/13 2100 142/43 mmHg - - - - -  09/20/13 2009 148/52 mmHg 98.2 F (36.8 C) Oral 84 18 98 %  09/20/13 1624 - - - - 18 99 %  09/20/13 1414 114/42 mmHg 99.1 F (37.3 C) - 72 18 100 %  09/20/13 1412 98/38 mmHg - - - - -  09/20/13 1408 108/45 mmHg - - - - -  09/20/13 1404 119/44 mmHg - - - - -  09/20/13 1357 129/36 mmHg - - - - -      Intake/Output from previous day:   01/14 0701 - 01/15 0700 In: 1665 [P.O.:360; I.V.:1305] Out: -    Intake/Output this shift:   01/15 0701 - 01/15 1900 In: 240 [P.O.:240] Out: -    Intake/Output     01/14 0701 - 01/15 0700 01/15 0701 - 01/16 0700   P.O. 360 240   I.V. 1305    Total Intake 1665 240   Urine     Drains     Blood     Total Output       Net +1665 +240        Urine Occurrence 4 x 1 x      LABORATORY DATA:  Recent Labs  09/20/13 0545 09/21/13 0238  WBC 8.7 11.0*  HGB 10.8* 10.6*  HCT 30.5* 29.8*  PLT 184 180    Recent Labs  09/20/13 0545 09/21/13 0238  NA 140 136*  K 4.0 4.0  CL 104 99  CO2 26 24  BUN 12 7  CREATININE 0.73 0.57  GLUCOSE 112* 133*  CALCIUM 8.3* 8.6   Lab Results  Component Value Date   INR 1.03 09/13/2013     Recent Radiographic Studies :  Chest 2 View  09/13/2013   CLINICAL DATA:  Pre-op respiratory exam. Left total knee replacement.  EXAM: CHEST  2 VIEW  COMPARISON:  None.  FINDINGS: The heart size and mediastinal contours are within normal limits. Mild scarring noted in the anterior right upper lobe. Both lungs are otherwise clear. No mass or lymphadenopathy identified. The visualized skeletal structures are unremarkable.  IMPRESSION: No active cardiopulmonary disease.   Electronically Signed   By: Earle Gell M.D.   On: 09/13/2013 16:46     Examination:  General appearance: alert, cooperative and no distress  Wound Exam: clean,  dry, intact   Drainage:  None: wound tissue dry  Motor Exam: EHL, FHL, Anterior Tibial and Posterior Tibial Intact  Sensory Exam: Superficial Peroneal, Deep Peroneal and Tibial normal  Vascular Exam: Normal  Assessment:    2 Days Post-Op  Procedure(s) (LRB): TOTAL KNEE ARTHROPLASTY (Left)  ADDITIONAL DIAGNOSIS:  Principal Problem:   Osteoarthritis of left knee Active Problems:   S/P total knee replacement using cement   Obesity, unspecified  no new problems   Plan.  DVT Prophylaxis:  Xarelto  DISCHARGE PLAN: Home  DISCHARGE NEEDS: HHPT, CPM, Walker and 3-in-1 comode seat   dressing changed, wound clean-good effort in PT, OK for D/C     [name] W 09/21/2013 12:21 PM

## 2013-09-21 NOTE — Discharge Summary (Signed)
[name], MD   [name], PA-C [address] [place], [place]  [address]                             [phone]  PATIENT ID: [name]        MRN:  [id number]          DOB/AGE: 03-18-1951 / 63 y.o.    DISCHARGE SUMMARY  ADMISSION DATE:    09/19/2013 DISCHARGE DATE:   09/21/2013   ADMISSION DIAGNOSIS: LEFT KNEE OSTEOARTHRITIS    DISCHARGE DIAGNOSIS:  LEFT KNEE OSTEOARTHRITIS    ADDITIONAL DIAGNOSIS: Principal Problem:   Osteoarthritis of left knee Active Problems:   S/P total knee replacement using cement   Obesity, unspecified  Past Medical History  Diagnosis Date  . PONV (postoperative nausea and vomiting)   . Cancer     melanoma back  . Arthritis     PROCEDURE: Procedure(s): TOTAL KNEE ARTHROPLASTY Left on 09/19/2013  CONSULTS: none     HISTORY: [name] is a very pleasant 63 year old white female who is seen today for evaluation of her left knee. She has had an extended history of problems with the left knee dating back into the early 1980s. She has had 3 surgeries on the left knee, one in 1982, that of an arthroscopy, one in 1983 for ligamentous repair probably of the patellofemoral joint and 1984 when she had a knee scope on the left for debridement. She has had multiple injections over the years. She has had recent cortisone injections, which make the knee feel just a little bit better, but she is now to the point where she would like to consider further evaluation for total joint replacements. She is now having pain with every step and pain at night. She has pain with her activities of daily living. She has decreased range of motion and inability to fully extend the knee is also noted   HOSPITAL COURSE:  [name] is a 63 y.o. admitted on 09/19/2013 and found to have a diagnosis of [place].  After appropriate laboratory studies were obtained  they were taken to the operating room on 09/19/2013 and underwent   Procedure(s): TOTAL KNEE ARTHROPLASTY  Left.   They were given perioperative antibiotics:  Anti-infectives   Start     Dose/Rate Route Frequency Ordered Stop   09/19/13 2000  ciprofloxacin (CIPRO) tablet 250 mg  Status:  Discontinued     250 mg Oral 2 times daily 09/19/13 1436 09/21/13 0946   09/19/13 1600  ceFAZolin (ANCEF) IVPB 2 g/50 mL premix     2 g 100 mL/hr over 30 Minutes Intravenous Every 6 hours 09/19/13 1436 09/19/13 2240   09/19/13 0600  ceFAZolin (ANCEF) 3 g in dextrose 5 % 50 mL IVPB     3 g 160 mL/hr over 30 Minutes Intravenous On call to O.R. 09/18/13 1407 09/19/13 0734    .  Tolerated the procedure well.  Placed with a foley intraoperatively.     Toradol was given post op.  POD #1, allowed out of bed to a chair.  PT for ambulation and exercise program.  Foley D/C'd in morning.  IV saline locked.  O2 discontionued. Hemovac pulled.  Had an episode of presyncope.  POD #2, continued PT and ambulation.   . The remainder of the hospital course was dedicated to ambulation and strengthening.   The patient was discharged on 2 Days Post-Op  in  Stable condition.  Blood products given:none  DIAGNOSTIC STUDIES: Recent vital signs: Patient Vitals for the past 24 hrs:  BP Temp Temp src Pulse Resp SpO2  09/21/13 1029 163/55 mmHg - - 86 - -  09/21/13 0542 139/51 mmHg 99.3 F (37.4 C) Oral 83 18 99 %  09/20/13 2100 142/43 mmHg - - - - -  09/20/13 2009 148/52 mmHg 98.2 F (36.8 C) Oral 84 18 98 %  09/20/13 1624 - - - - 18 99 %  09/20/13 1414 114/42 mmHg 99.1 F (37.3 C) - 72 18 100 %  09/20/13 1412 98/38 mmHg - - - - -  09/20/13 1408 108/45 mmHg - - - - -  09/20/13 1404 119/44 mmHg - - - - -  09/20/13 1357 129/36 mmHg - - - - -       Recent laboratory studies:  Recent Labs  09/20/13 0545 09/21/13 0238  WBC 8.7 11.0*  HGB 10.8* 10.6*  HCT 30.5* 29.8*  PLT 184 180    Recent Labs  09/20/13 0545 09/21/13 0238  NA 140 136*  K 4.0 4.0  CL 104 99  CO2 26 24  BUN  12 7  CREATININE 0.73 0.57  GLUCOSE 112* 133*  CALCIUM 8.3* 8.6   Lab Results  Component Value Date   INR 1.03 09/13/2013     Recent Radiographic Studies :  Chest 2 View  09/13/2013   CLINICAL DATA:  Pre-op respiratory exam. Left total knee replacement.  EXAM: CHEST  2 VIEW  COMPARISON:  None.  FINDINGS: The heart size and mediastinal contours are within normal limits. Mild scarring noted in the anterior right upper lobe. Both lungs are otherwise clear. No mass or lymphadenopathy identified. The visualized skeletal structures are unremarkable.  IMPRESSION: No active cardiopulmonary disease.   Electronically Signed   By: Earle Gell M.D.   On: 09/13/2013 16:46    DISCHARGE INSTRUCTIONS: Discharge Orders   Future Orders Complete By Expires   Call MD / Call 911  As directed    Comments:     If you experience chest pain or shortness of breath, CALL 911 and be transported to the hospital emergency room.  If you develope a fever above 101 F, pus (white drainage) or increased drainage or redness at the wound, or calf pain, call your surgeon's office.   Change dressing  As directed    Comments:     Change dressing on SUNDAY, then change the dressing daily with sterile 4 x 4 inch gauze dressing and apply TED hose.  You may clean the incision with alcohol prior to redressing.   Constipation Prevention  As directed    Comments:     Drink plenty of fluids.  Prune juice and/or coffee may be helpful.  You may use a stool softener, such as Colace (over the counter) 100 mg twice a day.  Use MiraLax (over the counter) for constipation as needed but this may take several days to work.  Mag Citrate --OR-- Milk of Magnesia --OR -- Dulcolax pills/suppositories may also be used but follow directions on the label.   CPM  As directed    Comments:     Continuous passive motion machine (CPM):      Use the CPM from 0 to 60 for 6-8 hours per day.      You may increase by 5-10 per day.  You may break it up into 2 or 3  sessions per day.  Use CPM for 3-4 weeks or until you are told to stop.   Diet - low sodium heart healthy  As directed    Diet general  As directed    Discharge instructions  As directed    Comments:     YOU WERE GIVEN A DEVICE CALLED AN INCENTIVE SPIROMETER TO HELP YOU TAKE DEEP BREATHS.  PLEASE USE THIS AT LEAST TEN (10) TIMES EVERY 1-2 HOURS EVERY DAY TO PREVENT PNEUMONIA.   Do not put a pillow under the knee. Place it under the heel.  As directed    Driving restrictions  As directed    Comments:     No driving for 6 weeks   Increase activity slowly as tolerated  As directed    Lifting restrictions  As directed    Comments:     No lifting for 6 weeks   Partial weight bearing  As directed    Comments:     50 % WEIGHT BEARING AS TAUGHT IN PHYSICAL THERAPY   Questions:     % Body Weight:     Laterality:     Extremity:     Patient may shower  As directed    Comments:     You may shower over the brown dressing.  Once the dressing is removed you may shower without a dressing once there is no drainage.  Do not wash over the wound.  If drainage remains, cover wound with plastic wrap and then shower.   TED hose  As directed    Comments:     Use stockings (TED hose) for 2 weeks on operative leg(s).  You may remove them at night for sleeping.      DISCHARGE MEDICATIONS:     Medication List    STOP taking these medications       aspirin 81 MG tablet     ciprofloxacin 250 MG tablet  Commonly known as:  CIPRO     diphenhydrAMINE 25 mg capsule  Commonly known as:  BENADRYL     ibuprofen 200 MG tablet  Commonly known as:  ADVIL,MOTRIN      TAKE these medications       acetaminophen 325 MG tablet  Commonly known as:  TYLENOL  Take 2 tablets (650 mg total) by mouth every 6 (six) hours as needed for mild pain (or Fever >/= 101).     fexofenadine 180 MG tablet  Commonly known as:  ALLEGRA  Take 180 mg by mouth daily as needed for allergies.     methocarbamol 500 MG  tablet  Commonly known as:  ROBAXIN  Take 1 tablet (500 mg total) by mouth every 8 (eight) hours as needed for muscle spasms.     metoprolol tartrate 25 MG tablet  Commonly known as:  LOPRESSOR  Take 25 mg by mouth 2 (two) times daily.     oxyCODONE 5 MG immediate release tablet  Commonly known as:  Oxy IR/ROXICODONE  Take 1-2 tablets (5-10 mg total) by mouth every 4 (four) hours as needed for moderate pain, severe pain or breakthrough pain.     rivaroxaban 10 MG Tabs tablet  Commonly known as:  XARELTO  Take 1 tablet (10 mg total) by mouth daily.     tretinoin 0.05 % cream  Commonly known as:  RETIN-A  Apply 1 application topically every morning.        FOLLOW UP VISIT:       Follow-up Information   Follow up with [facility],  [name], MD. Call on 10/02/2013.   Specialty:  Orthopedic Surgery   Contact information:   [address] [place] [address] [phone]       Call Laverda Page, MD. (CALL AND ASK ABOUT THE [place])    Specialty:  Cardiology   Contact information:   [address]  [place] [address] [phone]       DISPOSITION:   Home  CONDITION:  Stable   Kamyla Olejnik 09/21/2013, 12:32 PM

## 2013-09-21 NOTE — Addendum Note (Signed)
Addendum created 09/21/13 1241 by Napoleon Form, MD   Modules edited: Anesthesia Attestations

## 2014-05-10 ENCOUNTER — Other Ambulatory Visit (HOSPITAL_COMMUNITY): Payer: Self-pay | Admitting: Orthopaedic Surgery

## 2014-05-10 DIAGNOSIS — M25561 Pain in right knee: Secondary | ICD-10-CM

## 2014-05-16 ENCOUNTER — Ambulatory Visit (HOSPITAL_COMMUNITY)
Admission: RE | Admit: 2014-05-16 | Discharge: 2014-05-16 | Disposition: A | Discharge status: Home / Self Care | Payer: BC Managed Care – PPO | Source: Ambulatory Visit | Attending: Orthopaedic Surgery | Admitting: Orthopaedic Surgery

## 2014-05-16 DIAGNOSIS — M948X9 Other specified disorders of cartilage, unspecified sites: Secondary | ICD-10-CM | POA: Insufficient documentation

## 2014-05-16 DIAGNOSIS — M25569 Pain in unspecified knee: Secondary | ICD-10-CM | POA: Diagnosis present

## 2014-05-16 DIAGNOSIS — M25561 Pain in right knee: Secondary | ICD-10-CM

## 2014-05-16 DIAGNOSIS — M1710 Unilateral primary osteoarthritis, unspecified knee: Secondary | ICD-10-CM | POA: Insufficient documentation

## 2015-02-27 ENCOUNTER — Other Ambulatory Visit (HOSPITAL_COMMUNITY): Payer: Self-pay | Admitting: Orthopaedic Surgery

## 2015-02-27 ENCOUNTER — Ambulatory Visit (HOSPITAL_COMMUNITY)
Admission: RE | Admit: 2015-02-27 | Discharge: 2015-02-27 | Disposition: A | Discharge status: Home / Self Care | Payer: BLUE CROSS/BLUE SHIELD | Source: Ambulatory Visit | Attending: Orthopaedic Surgery | Admitting: Orthopaedic Surgery

## 2015-02-27 DIAGNOSIS — M79661 Pain in right lower leg: Secondary | ICD-10-CM

## 2016-06-17 DIAGNOSIS — Z23 Encounter for immunization: Secondary | ICD-10-CM | POA: Diagnosis not present

## 2016-06-30 DIAGNOSIS — Z1231 Encounter for screening mammogram for malignant neoplasm of breast: Secondary | ICD-10-CM | POA: Diagnosis not present

## 2016-07-09 DIAGNOSIS — I10 Essential (primary) hypertension: Secondary | ICD-10-CM | POA: Diagnosis not present

## 2016-07-09 DIAGNOSIS — M1711 Unilateral primary osteoarthritis, right knee: Secondary | ICD-10-CM | POA: Diagnosis not present

## 2016-07-09 DIAGNOSIS — E782 Mixed hyperlipidemia: Secondary | ICD-10-CM | POA: Diagnosis not present

## 2016-07-13 ENCOUNTER — Other Ambulatory Visit (HOSPITAL_COMMUNITY): Payer: Self-pay | Admitting: Family Medicine

## 2016-07-13 DIAGNOSIS — M1711 Unilateral primary osteoarthritis, right knee: Secondary | ICD-10-CM | POA: Diagnosis not present

## 2016-07-13 DIAGNOSIS — Z9189 Other specified personal risk factors, not elsewhere classified: Secondary | ICD-10-CM | POA: Diagnosis not present

## 2016-07-13 DIAGNOSIS — Z6830 Body mass index (BMI) 30.0-30.9, adult: Secondary | ICD-10-CM | POA: Diagnosis not present

## 2016-07-13 DIAGNOSIS — M179 Osteoarthritis of knee, unspecified: Secondary | ICD-10-CM

## 2016-07-13 DIAGNOSIS — J301 Allergic rhinitis due to pollen: Secondary | ICD-10-CM | POA: Diagnosis not present

## 2016-07-13 DIAGNOSIS — M1710 Unilateral primary osteoarthritis, unspecified knee: Secondary | ICD-10-CM

## 2016-07-13 DIAGNOSIS — I10 Essential (primary) hypertension: Secondary | ICD-10-CM | POA: Diagnosis not present

## 2016-07-13 DIAGNOSIS — E782 Mixed hyperlipidemia: Secondary | ICD-10-CM | POA: Diagnosis not present

## 2016-07-14 ENCOUNTER — Telehealth (INDEPENDENT_AMBULATORY_CARE_PROVIDER_SITE_OTHER): Payer: Self-pay | Admitting: Orthopaedic Surgery

## 2016-07-20 ENCOUNTER — Other Ambulatory Visit (HOSPITAL_COMMUNITY): Payer: Self-pay | Admitting: Family Medicine

## 2016-07-20 DIAGNOSIS — M179 Osteoarthritis of knee, unspecified: Secondary | ICD-10-CM

## 2016-07-20 DIAGNOSIS — M810 Age-related osteoporosis without current pathological fracture: Secondary | ICD-10-CM

## 2016-07-20 DIAGNOSIS — M1710 Unilateral primary osteoarthritis, unspecified knee: Secondary | ICD-10-CM

## 2016-07-22 ENCOUNTER — Ambulatory Visit (HOSPITAL_COMMUNITY)
Admission: RE | Admit: 2016-07-22 | Discharge: 2016-07-22 | Disposition: A | Discharge status: Home / Self Care | Payer: Medicare Other | Source: Ambulatory Visit | Attending: Family Medicine | Admitting: Family Medicine

## 2016-07-22 DIAGNOSIS — M810 Age-related osteoporosis without current pathological fracture: Secondary | ICD-10-CM | POA: Diagnosis present

## 2016-07-22 DIAGNOSIS — M85852 Other specified disorders of bone density and structure, left thigh: Secondary | ICD-10-CM | POA: Diagnosis not present

## 2016-07-22 DIAGNOSIS — M8589 Other specified disorders of bone density and structure, multiple sites: Secondary | ICD-10-CM | POA: Diagnosis not present

## 2016-07-22 DIAGNOSIS — M1711 Unilateral primary osteoarthritis, right knee: Secondary | ICD-10-CM | POA: Diagnosis not present

## 2016-07-22 DIAGNOSIS — M1710 Unilateral primary osteoarthritis, unspecified knee: Secondary | ICD-10-CM

## 2016-07-22 DIAGNOSIS — M8588 Other specified disorders of bone density and structure, other site: Secondary | ICD-10-CM | POA: Diagnosis not present

## 2016-07-22 DIAGNOSIS — M179 Osteoarthritis of knee, unspecified: Secondary | ICD-10-CM

## 2016-08-04 NOTE — Telephone Encounter (Signed)
ERROR

## 2016-09-08 ENCOUNTER — Telehealth (INDEPENDENT_AMBULATORY_CARE_PROVIDER_SITE_OTHER): Payer: Self-pay | Admitting: Orthopaedic Surgery

## 2016-09-08 NOTE — Telephone Encounter (Signed)
Short stay at [facility] called stating pt has a pre-op appt tomorrow and needs orders put in. Thank you so much!

## 2016-09-08 NOTE — Pre-Procedure Instructions (Addendum)
[name]  09/08/2016      [facility] - [place], [address] [place] [address] Phone: [phone] Fax: [phone]  [id number] - [place], [place] [address] [place] [address] Phone: [phone] Fax: [phone]    Your procedure is scheduled on Tuesday January 16.  Report to [facility] Admitting at 5:30 A.M.  Call this number if you have problems the morning of surgery:  [phone]   Remember:  Do not eat food or drink liquids after midnight.  Take these medicines the morning of surgery with A SIP OF WATER: metoprolol (lopressor), tramadol (ultram) if needed  7 days prior to surgery STOP taking any diclofenac (voltaren) gel, Aspirin, Aleve, Naproxen, Ibuprofen, Motrin, Advil, Goody's, BC's, all herbal medications, fish oil, and all vitamins,coq10    Do not wear jewelry, make-up or nail polish.  Do not wear lotions, powders, or perfumes, or deoderant.  Do not shave 48 hours prior to surgery.  Men may shave face and neck.  Do not bring valuables to the hospital.  [facility] is not responsible for any belongings or valuables.  Contacts, dentures or bridgework may not be worn into surgery.  Leave your suitcase in the car.  After surgery it may be brought to your room.  For patients admitted to the hospital, discharge time will be determined by your treatment team.  Patients discharged the day of surgery will not be allowed to drive home.    Special instructions:    [facility]- Preparing For Surgery  Before surgery, you can play an important role. Because skin is not sterile, your skin needs to be as free of germs as possible. You can reduce the number of germs on your skin by washing with CHG (chlorahexidine gluconate) Soap before surgery.  CHG is an antiseptic cleaner which kills germs and bonds with the skin to continue killing germs even after  washing.  Please do not use if you have an allergy to CHG or antibacterial soaps. If your skin becomes reddened/irritated stop using the CHG.  Do not shave (including legs and underarms) for at least 48 hours prior to first CHG shower. It is OK to shave your face.  Please follow these instructions carefully.   1. Shower the NIGHT BEFORE SURGERY and the MORNING OF SURGERY with CHG.   2. If you chose to wash your hair, wash your hair first as usual with your normal shampoo.  3. After you shampoo, rinse your hair and body thoroughly to remove the shampoo.  4. Use CHG as you would any other liquid soap. You can apply CHG directly to the skin and wash gently with a scrungie or a clean washcloth.   5. Apply the CHG Soap to your body ONLY FROM THE NECK DOWN.  Do not use on open wounds or open sores. Avoid contact with your eyes, ears, mouth and genitals (private parts). Wash genitals (private parts) with your normal soap.  6. Wash thoroughly, paying special attention to the area where your surgery will be performed.  7. Thoroughly rinse your body with warm water from the neck down.  8. DO NOT shower/wash with your normal soap after using and rinsing off the CHG Soap.  9. Pat yourself dry with a CLEAN TOWEL.   10. Wear CLEAN PAJAMAS   11. Place CLEAN SHEETS on your bed the night  of your first shower and DO NOT SLEEP WITH PETS.    Day of Surgery: Do not apply any deodorants/lotions. Please wear clean clothes to the hospital/surgery center.      Please read over the  fact sheets that you were given.

## 2016-09-09 ENCOUNTER — Encounter (INDEPENDENT_AMBULATORY_CARE_PROVIDER_SITE_OTHER): Payer: Self-pay | Admitting: Orthopedic Surgery

## 2016-09-09 ENCOUNTER — Encounter (HOSPITAL_COMMUNITY): Payer: Self-pay

## 2016-09-09 ENCOUNTER — Ambulatory Visit (INDEPENDENT_AMBULATORY_CARE_PROVIDER_SITE_OTHER): Payer: Medicare HMO | Admitting: Orthopedic Surgery

## 2016-09-09 ENCOUNTER — Ambulatory Visit (HOSPITAL_COMMUNITY)
Admission: RE | Admit: 2016-09-09 | Discharge: 2016-09-09 | Disposition: A | Discharge status: Home / Self Care | Payer: Medicare HMO | Source: Ambulatory Visit | Attending: Orthopedic Surgery | Admitting: Orthopedic Surgery

## 2016-09-09 ENCOUNTER — Encounter (HOSPITAL_COMMUNITY)
Admission: RE | Admit: 2016-09-09 | Discharge: 2016-09-09 | Disposition: A | Discharge status: Home / Self Care | Payer: Medicare HMO | Source: Ambulatory Visit | Attending: Orthopaedic Surgery | Admitting: Orthopaedic Surgery

## 2016-09-09 DIAGNOSIS — I10 Essential (primary) hypertension: Secondary | ICD-10-CM | POA: Diagnosis not present

## 2016-09-09 DIAGNOSIS — Z96652 Presence of left artificial knee joint: Secondary | ICD-10-CM | POA: Diagnosis not present

## 2016-09-09 DIAGNOSIS — Z01818 Encounter for other preprocedural examination: Secondary | ICD-10-CM

## 2016-09-09 DIAGNOSIS — Z01812 Encounter for preprocedural laboratory examination: Secondary | ICD-10-CM | POA: Insufficient documentation

## 2016-09-09 DIAGNOSIS — R001 Bradycardia, unspecified: Secondary | ICD-10-CM | POA: Diagnosis not present

## 2016-09-09 DIAGNOSIS — Z87891 Personal history of nicotine dependence: Secondary | ICD-10-CM | POA: Diagnosis not present

## 2016-09-09 DIAGNOSIS — Z85820 Personal history of malignant melanoma of skin: Secondary | ICD-10-CM | POA: Diagnosis not present

## 2016-09-09 DIAGNOSIS — M1711 Unilateral primary osteoarthritis, right knee: Secondary | ICD-10-CM | POA: Insufficient documentation

## 2016-09-09 DIAGNOSIS — Z0183 Encounter for blood typing: Secondary | ICD-10-CM | POA: Insufficient documentation

## 2016-09-09 HISTORY — DX: Adverse effect of unspecified anesthetic, initial encounter: T41.45XA

## 2016-09-09 HISTORY — DX: Essential (primary) hypertension: I10

## 2016-09-09 LAB — CBC WITH DIFFERENTIAL/PLATELET
BASOS PCT: 0 %
Basophils Absolute: 0 10*3/uL (ref 0.0–0.1)
EOS ABS: 0.1 10*3/uL (ref 0.0–0.7)
Eosinophils Relative: 1 %
HCT: 37.6 % (ref 36.0–46.0)
HEMOGLOBIN: 13.5 g/dL (ref 12.0–15.0)
Lymphocytes Relative: 33 %
Lymphs Abs: 2.3 10*3/uL (ref 0.7–4.0)
MCH: 33.3 pg (ref 26.0–34.0)
MCHC: 35.9 g/dL (ref 30.0–36.0)
MCV: 92.6 fL (ref 78.0–100.0)
MONO ABS: 0.5 10*3/uL (ref 0.1–1.0)
MONOS PCT: 7 %
Neutro Abs: 4 10*3/uL (ref 1.7–7.7)
Neutrophils Relative %: 59 %
Platelets: 247 10*3/uL (ref 150–400)
RBC: 4.06 MIL/uL (ref 3.87–5.11)
RDW: 12.4 % (ref 11.5–15.5)
WBC: 7 10*3/uL (ref 4.0–10.5)

## 2016-09-09 LAB — COMPREHENSIVE METABOLIC PANEL
ALBUMIN: 4.5 g/dL (ref 3.5–5.0)
ALK PHOS: 52 U/L (ref 38–126)
ALT: 18 U/L (ref 14–54)
ANION GAP: 8 (ref 5–15)
AST: 22 U/L (ref 15–41)
BILIRUBIN TOTAL: 0.6 mg/dL (ref 0.3–1.2)
BUN: 23 mg/dL — ABNORMAL HIGH (ref 6–20)
CALCIUM: 9.9 mg/dL (ref 8.9–10.3)
CO2: 26 mmol/L (ref 22–32)
Chloride: 104 mmol/L (ref 101–111)
Creatinine, Ser: 0.94 mg/dL (ref 0.44–1.00)
GFR calc non Af Amer: >60 mL/min (ref >60)
GLUCOSE: 99 mg/dL (ref 65–99)
POTASSIUM: 4.1 mmol/L (ref 3.5–5.1)
SODIUM: 138 mmol/L (ref 135–145)
TOTAL PROTEIN: 8.2 g/dL — AB (ref 6.5–8.1)

## 2016-09-09 LAB — TYPE AND SCREEN
ABO/RH(D): O POS
ANTIBODY SCREEN: NEGATIVE

## 2016-09-09 LAB — SURGICAL PCR SCREEN
MRSA, PCR: NEGATIVE
Staphylococcus aureus: NEGATIVE

## 2016-09-09 LAB — APTT: aPTT: 32 seconds (ref 24–36)

## 2016-09-09 LAB — PROTIME-INR
INR: 0.99
Prothrombin Time: 13.1 seconds (ref 11.4–15.2)

## 2016-09-09 NOTE — Progress Notes (Signed)
Renee Fears, MD   [name], PA-C [address] [place], [place]  [address]                             [phone]   ORTHOPAEDIC HISTORY & PHYSICAL  [name] MRN:  [id number] DOB/SEX:  1951-03-29/female  CHIEF COMPLAINT:  Painful right Knee  HISTORY: Patient is a 66 y.o. female presented with a history of pain in the right knee for 7 years. Initially in June 2010 she had developed a significant pain in her right knee. Dr. Quillian Quince a [facility] and [facility] had injected her that only gave minimal relief. MRI scan was performed and the results revealed tricompartmental degenerative changes with full thickness cartilage loss along the lateral facet.  She has diffuse cartilage irregularity along the medial facet.  She has some areas of full thickness cartilage loss in the medial compartment and a possible medial meniscal tear.  She underwent an arthroscopic partial medial lateral meniscectomy of the right knee with shaving of the medial compartment total femoral joint of the right knee on 03/07/2009.  She will return back in September 2015, and a repeat MRI scan revealed considerable degenerative changes in all 3 compartments progress from her 2010 scan. It is particularly more prominent in the medial compartment of the patellofemoral joint but also had complete cartilage loss laterally. She had difficulty with anti-inflammatories because of her blood pressure. Corticosteroid injection was started and Euflexxa series of 3 was given to the right knee following the cortisone injection.  In February 2016 , she had a corticosteroid injection. In May and followed a 5 series of Hyalgan injections. In July 2017 she returned and had a steroid injection. Her symptoms were progressing well at that time she did not want to consider total knee replacement. Since that time however her symptoms have worsened. Seen today for evaluation.  nset of symptoms was abrupt starting 7  years ago with gradually worsening course since that time. Prior procedures on the knee are arthroscopy. Patient has been treated conservatively with over-the-counter NSAIDs and activity modification. Patient currently rates pain in the knee at 5-8 out of 10 with activity. There is pain at night. present.  They have been previously treated with: NSAIDS: Diclofenac gel with mild improvement  Knee injection with corticosteroid  was performed Knee injection with visco supplementation was performed   PAST MEDICAL HISTORY: Patient Active Problem List   Diagnosis Date Noted  . Unilateral primary osteoarthritis, right knee 09/09/2016  . Osteoarthritis of left knee 09/20/2013  . Obesity, unspecified 09/20/2013  . S/P total knee replacement using cement 09/19/2013   Past Medical History:  Diagnosis Date  . Arthritis   . Cancer ([place])    melanoma back  . Complication of anesthesia    ? bp- passed out after in room.   Marland Kitchen Hypertension    Past Surgical History:  Procedure Laterality Date  . APPENDECTOMY  80  . HAND SURGERY Right 06   foreign object  . KNEE ARTHROSCOPY Left 82,84  . KNEE ARTHROSCOPY Right 10  . KNEE SURGERY Left 83   ligament  . TOTAL KNEE ARTHROPLASTY Left 09/19/2013   Procedure: TOTAL KNEE ARTHROPLASTY;  Surgeon: Garald Balding, MD;  Location: [facility];  Service: Orthopedics;  Laterality: Left;  . TUBAL LIGATION  75     MEDICATIONS PRIOR TO ADMISSION: Prior to Admission medications   Medication Sig Start Date End  Date Taking? Authorizing Provider  Calcium Carbonate-Vitamin D (CALTRATE 600+D PO) Take 1 tablet by mouth 2 (two) times daily.    Historical Provider, MD  Coenzyme Q10 (CO Q10) 100 MG CAPS Take 100 mg by mouth daily.    Historical Provider, MD  diclofenac sodium (VOLTAREN) 1 % GEL Apply 1 application topically 3 (three) times daily as needed (knee pain).  06/11/16   Historical Provider, MD  diphenhydrAMINE (BENADRYL) 25 MG tablet Take 25 mg by mouth daily as  needed for allergies.    Historical Provider, MD  lisinopril-hydrochlorothiazide (PRINZIDE,ZESTORETIC) 20-12.5 MG tablet Take 1 tablet by mouth daily at 12 noon.    Historical Provider, MD  metoprolol tartrate (LOPRESSOR) 25 MG tablet Take 12.5 mg by mouth 2 (two) times daily.     Historical Provider, MD  montelukast (SINGULAIR) 10 MG tablet Take 10 mg by mouth at bedtime.    Historical Provider, MD  traMADol (ULTRAM) 50 MG tablet Take 50 mg by mouth 2 (two) times daily as needed for pain. 06/11/16   Historical Provider, MD  tretinoin (RETIN-A) 0.05 % cream Apply 1 application topically every morning. Apply to face    Historical Provider, MD     ALLERGIES:   Allergies  Allergen Reactions  . Norco [Hydrocodone-Acetaminophen] Nausea And Vomiting and Anxiety  . Other Nausea And Vomiting and Anxiety    Darvocet    REVIEW OF SYSTEMS:  Review of Systems  Constitutional:       History of melanoma with resection 2006  HENT: Negative.   Eyes: Negative.   Respiratory: Negative.   Cardiovascular:       History of hypertension  Gastrointestinal: Negative.   Genitourinary:       Nocturia twice nightly  Musculoskeletal:       Osteopenia  Skin: Negative.   Neurological: Negative.   Endo/Heme/Allergies: Negative.   Psychiatric/Behavioral: Negative.     FAMILY HISTORY:  No family history on file.  SOCIAL HISTORY:   Social History   Occupational History  . Not on file.   Social History Main Topics  . Smoking status: Former Smoker    Packs/day: 2.00    Years: 20.00    Types: Cigarettes    Quit date: 07/10/1993  . Smokeless tobacco: Never Used  . Alcohol use Yes     Comment: occ wine  . Drug use: No  . Sexual activity: Not on file     EXAMINATION:  Vital signs in last 24 hours: BP 135/60   Pulse 60   Temp 98.4 F (36.9 C)   Resp 16   Ht 5' (1.524 m)   Wt 164 lb (74.4 kg)   BMI 32.03 kg/m   Physical Exam  Constitutional: She is oriented to person, place, and time. She  appears well-developed and well-nourished.  HENT:  Head: Normocephalic and atraumatic.  Eyes: EOM are normal. Pupils are equal, round, and reactive to light.  Neck: Neck supple.  No carotid bruits  Cardiovascular: Normal rate, regular rhythm and intact distal pulses.   Murmur (rade 1-2 systoloc murmur) heard. Pulmonary/Chest: Effort normal and breath sounds normal.  Abdominal: Soft. Bowel sounds are normal. She exhibits no mass.  Musculoskeletal:       Right knee: She exhibits effusion (Trace).  Neurological: She is alert and oriented to person, place, and time.  Skin: Skin is warm and dry.  Psychiatric: She has a normal mood and affect. Her behavior is normal. Judgment and thought content normal.   Right Knee Exam  Tenderness  The patient is experiencing tenderness in the medial joint line and lateral joint line.  Range of Motion  Extension: 5  Right knee flexion: 105.   Tests  Drawer:       Anterior - negative      Other  Sensation: normal Pulse: present Swelling: mild Other tests: effusion (Trace) present      Imaging Review Plain radiographs demonstrate moderate degenerative joint disease of the right knee. The overall alignment is mild varus. The bone quality appears to be good for age and reported activity level.  ASSESSMENT: End stage arthritis, right knee  Past Medical History:  Diagnosis Date  . Arthritis   . Cancer ([place])    melanoma back  . Complication of anesthesia    ? bp- passed out after in room.   Marland Kitchen Hypertension     PLAN: Plan for right total knee replacement.  The patient history, physical examination and imaging studies are consistent with advanced degenerative joint disease of the right knee. The patient has failed conservative treatment.  The clearance notes were reviewed.  After discussion with the patient it was felt that Total Knee Replacement was indicated. The procedure,  risks, and benefits of total knee arthroplasty were presented  and reviewed. The risks including but not limited to aseptic loosening, infection, blood clots, vascular and nerve injury, stiffness, patella tracking problems and fracture complications among others were discussed. The patient acknowledged the explanation, agreed to proceed with total knee replacement.  [name] 09/09/2016, 1:49 PM

## 2016-09-09 NOTE — H&P (Signed)
ORTHOPAEDIC HISTORY & PHYSICAL  [name] MRN:  [id number] DOB/SEX:  1951-03-26/female  CHIEF COMPLAINT:  Painful right Knee  HISTORY: Patient is a 66 y.o. female presented with a history of pain in the right knee for 7 years. Initially in June 2010 she had developed a significant pain in her right knee. Dr. Quillian Quince a [facility] and [facility] had injected her that only gave minimal relief. MRI scan was performed and the results revealed tricompartmental degenerative changes with full thickness cartilage loss along the lateral facet.  She has diffuse cartilage irregularity along the medial facet.  She has some areas of full thickness cartilage loss in the medial compartment and a possible medial meniscal tear.  She underwent an arthroscopic partial medial lateral meniscectomy of the right knee with shaving of the medial compartment total femoral joint of the right knee on 03/07/2009.  She will return back in September 2015, and a repeat MRI scan revealed considerable degenerative changes in all 3 compartments progress from her 2010 scan. It is particularly more prominent in the medial compartment of the patellofemoral joint but also had complete cartilage loss laterally. She had difficulty with anti-inflammatories because of her blood pressure. Corticosteroid injection was started and Euflexxa series of 3 was given to the right knee following the cortisone injection.  In February 2016 , she had a corticosteroid injection. In May and followed a 5 series of Hyalgan injections. In July 2017 she returned and had a steroid injection. Her symptoms were progressing well at that time she did not want to consider total knee replacement. Since that time however her symptoms have worsened. Seen today for evaluation.  nset of symptoms was abrupt starting 7 years ago with gradually worsening course since that time. Prior procedures on the knee are arthroscopy. Patient has been treated  conservatively with over-the-counter NSAIDs and activity modification. Patient currently rates pain in the knee at 5-8 out of 10 with activity. There is pain at night. present.  They have been previously treated with: NSAIDS: Diclofenac gel with mild improvement  Knee injection with corticosteroid  was performed Knee injection with visco supplementation was performed   PAST MEDICAL HISTORY:     Patient Active Problem List   Diagnosis Date Noted  . Unilateral primary osteoarthritis, right knee 09/09/2016  . Osteoarthritis of left knee 09/20/2013  . Obesity, unspecified 09/20/2013  . S/P total knee replacement using cement 09/19/2013       Past Medical History:  Diagnosis Date  . Arthritis   . Cancer ([place])    melanoma back  . Complication of anesthesia    ? bp- passed out after in room.   Marland Kitchen Hypertension         Past Surgical History:  Procedure Laterality Date  . APPENDECTOMY  80  . HAND SURGERY Right 06   foreign object  . KNEE ARTHROSCOPY Left 82,84  . KNEE ARTHROSCOPY Right 10  . KNEE SURGERY Left 83   ligament  . TOTAL KNEE ARTHROPLASTY Left 09/19/2013   Procedure: TOTAL KNEE ARTHROPLASTY;  Surgeon: Garald Balding, MD;  Location: [place];  Service: Orthopedics;  Laterality: Left;  . TUBAL LIGATION  75     MEDICATIONS PRIOR TO ADMISSION:        Prior to Admission medications   Medication Sig Start Date End Date Taking? Authorizing Provider  Calcium Carbonate-Vitamin D (CALTRATE 600+D PO) Take 1 tablet by mouth 2 (two) times daily.    Historical Provider, MD  Coenzyme Q10 (  CO Q10) 100 MG CAPS Take 100 mg by mouth daily.    Historical Provider, MD  diclofenac sodium (VOLTAREN) 1 % GEL Apply 1 application topically 3 (three) times daily as needed (knee pain).  06/11/16   Historical Provider, MD  diphenhydrAMINE (BENADRYL) 25 MG tablet Take 25 mg by mouth daily as needed for allergies.    Historical Provider, MD   lisinopril-hydrochlorothiazide (PRINZIDE,ZESTORETIC) 20-12.5 MG tablet Take 1 tablet by mouth daily at 12 noon.    Historical Provider, MD  metoprolol tartrate (LOPRESSOR) 25 MG tablet Take 12.5 mg by mouth 2 (two) times daily.     Historical Provider, MD  montelukast (SINGULAIR) 10 MG tablet Take 10 mg by mouth at bedtime.    Historical Provider, MD  traMADol (ULTRAM) 50 MG tablet Take 50 mg by mouth 2 (two) times daily as needed for pain. 06/11/16   Historical Provider, MD  tretinoin (RETIN-A) 0.05 % cream Apply 1 application topically every morning. Apply to face    Historical Provider, MD     ALLERGIES:        Allergies  Allergen Reactions  . Norco [Hydrocodone-Acetaminophen] Nausea And Vomiting and Anxiety  . Other Nausea And Vomiting and Anxiety    Darvocet    REVIEW OF SYSTEMS:  Review of Systems  Constitutional:       History of melanoma with resection 2006  HENT: Negative.   Eyes: Negative.   Respiratory: Negative.   Cardiovascular:       History of hypertension  Gastrointestinal: Negative.   Genitourinary:       Nocturia twice nightly  Musculoskeletal:       Osteopenia  Skin: Negative.   Neurological: Negative.   Endo/Heme/Allergies: Negative.   Psychiatric/Behavioral: Negative.     FAMILY HISTORY:  No family history on file.  SOCIAL HISTORY:   Social History      Occupational History  . Not on file.   Social History Main Topics  . Smoking status: Former Smoker    Packs/day: 2.00    Years: 20.00    Types: Cigarettes    Quit date: 07/10/1993  . Smokeless tobacco: Never Used  . Alcohol use Yes     Comment: occ wine  . Drug use: No  . Sexual activity: Not on file     EXAMINATION:  Vital signs in last 24 hours: BP 135/60   Pulse 60   Temp 98.4 F (36.9 C)   Resp 16   Ht 5' (1.524 m)   Wt 164 lb (74.4 kg)   BMI 32.03 kg/m   Physical Exam  Constitutional: She is oriented to person, place, and time. She  appears well-developed and well-nourished.  HENT:  Head: Normocephalic and atraumatic.  Eyes: EOM are normal. Pupils are equal, round, and reactive to light.  Neck: Neck supple.  No carotid bruits  Cardiovascular: Normal rate, regular rhythm and intact distal pulses.   Murmur (rade 1-2 systoloc murmur) heard. Pulmonary/Chest: Effort normal and breath sounds normal.  Abdominal: Soft. Bowel sounds are normal. She exhibits no mass.  Musculoskeletal:       Right knee: She exhibits effusion (Trace).  Neurological: She is alert and oriented to person, place, and time.  Skin: Skin is warm and dry.  Psychiatric: She has a normal mood and affect. Her behavior is normal. Judgment and thought content normal.   Right Knee Exam   Tenderness  The patient is experiencing tenderness in the medial joint line and lateral joint line.  Range of  Motion  Extension: 5  Right knee flexion: 105.   Tests  Drawer:       Anterior - negative      Other  Sensation: normal Pulse: present Swelling: mild Other tests: effusion (Trace) present     Imaging Review Plain radiographs demonstrate moderate degenerative joint disease of the right knee. The overall alignment is mild varus. The bone quality appears to be good for age and reported activity level.  ASSESSMENT: End stage arthritis, right knee      Past Medical History:  Diagnosis Date  . Arthritis   . Cancer ([place])    melanoma back  . Complication of anesthesia    ? bp- passed out after in room.   Marland Kitchen Hypertension     PLAN: Plan for right total knee replacement.  The patient history, physical examination and imaging studies are consistent with advanced degenerative joint disease of the right knee. The patient has failed conservative treatment.  The clearance notes were reviewed.  After discussion with the patient it was felt that Total Knee Replacement was indicated. The procedure,  risks, and benefits of total knee  arthroplasty were presented and reviewed. The risks including but not limited to aseptic loosening, infection, blood clots, vascular and nerve injury, stiffness, patella tracking problems and fracture complications among others were discussed. The patient acknowledged the explanation, agreed to proceed with total knee replacement.  [name] 09/09/2016, 1:49 PM

## 2016-09-10 LAB — URINE CULTURE

## 2016-09-10 NOTE — Progress Notes (Signed)
Anesthesia Chart Review:  Pt is a 66 year old female scheduled for R total knee arthroplasty on 09/22/2016 with [name], MD.   - PCP is Gar Ponto, MD.  - Does not routinely see cardiology, but was evaluated by Adrian Prows, MD with cardiology prior to L TKA in 2015  PMH includes:  HTN, melanoma. Former smoker. BMI 32. S/p L TKA 09/19/13.   BP (!) 136/49   Pulse (!) 51   Temp 36.7 C   Resp 18   Ht 5' (1.524 m)   Wt 164 lb (74.4 kg)   SpO2 98%   BMI 32.03 kg/m    Medications include: lisinopril-hctz, metoprolol  Preoperative labs reviewed.    CXR 09/09/16: No active cardiopulmonary disease.  EKG 09/09/16: Sinus bradycardia (45 bpm)  Echo 10/12/13 [facility] cardiovascular): 1. LV cavity normal in size. Normal diastolic filling. Normal global wall motion. Normal systolic global function. Calculated EF 63%. 2. LA cavity moderately dilated. 3. Trace AI, MR and PCI. 4. Mild TR. No pulmonary hypertension  Carotid Doppler 10/09/13 [facility] cardiovascular): Evidence of hemodynamically significant stenosis and bilateral carotid bifurcation vessels. Evidence of heterogeneous plaque in the bilateral carotid artery.  Nuclear stress test 09/18/13 [facility] cardiovascular): 1. Mild soft tissue attenuation consistent with diaphragmatic attenuation. No ischemia or scar. LV systolic function normal EF 59%. 2. This is a low risk study.  If no changes, I anticipate pt can proceed with surgery as scheduled.   Willeen Cass, FNP-BC [facility]/Anesthesiology Phone: [phone] 09/10/2016 4:29 PM

## 2016-09-13 NOTE — H&P (Addendum)
Renee Fears, MD   [name], PA-C [address], [place], [place]  [address]                             [phone]   ORTHOPAEDIC HISTORY & PHYSICAL  [name] MRN:  [id number] DOB/SEX:  04/11/1951/female  CHIEF COMPLAINT:  Painful right Knee  HISTORY: Patient is a 66 y.o. female presented with a history of pain in the right knee for 7 years. Initially in June 2010 she had developed a significant pain in her right knee. Dr. Quillian Quince a day [facility] and [facility] had injected her that only gave minimal relief. MRI scan was performed and the results revealed tricompartmental degenerative changes with full thickness cartilage loss along the lateral facet.  She has diffuse cartilage irregularity along the medial facet.  She has some areas of full thickness cartilage loss in the medial compartment and a possible medial meniscal tear.  She underwent an arthroscopic partial medial lateral meniscectomy of the right knee with shaving of the medial compartment total femoral joint of the right knee on 03/07/2009.  She will return back in September 2015, and a repeat MRI scan revealed considerable degenerative changes in all 3 compartments progress from her 2010 scan. It is particularly more prominent in the medial compartment of the patellofemoral joint but also had complete cartilage loss laterally. She had difficulty with anti-inflammatories because of her blood pressure. Corticosteroid injection was started and Euflexxa series of 3 was given to the right knee following the cortisone injection.  In February 2016 , she had a corticosteroid injection. In May and followed a 5 series of Hyalgan injections. In July 2017 she returned and had a steroid injection. Her symptoms were progressing well at that time she did not want to consider total knee replacement. Since that time however her symptoms have worsened. Seen today for evaluation.  Onset of symptoms was abrupt  starting 7 years ago with gradually worsening course since that time. Prior procedures on the knee are arthroscopy. Patient has been treated conservatively with over-the-counter NSAIDs and activity modification. Patient currently rates pain in the knee at 5-8 out of 10 with activity. There is pain at night. present.  They have been previously treated with: NSAIDS: Diclofenac gel with mild improvement  Knee injection with corticosteroid  was performed Knee injection with visco supplementation was performed   PAST MEDICAL HISTORY:     Patient Active Problem List   Diagnosis Date Noted  . Unilateral primary osteoarthritis, right knee 09/09/2016  . Osteoarthritis of left knee 09/20/2013  . Obesity, unspecified 09/20/2013  . S/P total knee replacement using cement 09/19/2013       Past Medical History:  Diagnosis Date  . Arthritis   . Cancer ([place])    melanoma back  . Complication of anesthesia    ? bp- passed out after in room.   Marland Kitchen Hypertension         Past Surgical History:  Procedure Laterality Date  . APPENDECTOMY  80  . HAND SURGERY Right 06   foreign object  . KNEE ARTHROSCOPY Left 82,84  . KNEE ARTHROSCOPY Right 10  . KNEE SURGERY Left 83   ligament  . TOTAL KNEE ARTHROPLASTY Left 09/19/2013   Procedure: TOTAL KNEE ARTHROPLASTY;  Surgeon: Garald Balding, MD;  Location: [facility];  Service: Orthopedics;  Laterality: Left;  . TUBAL LIGATION  75     MEDICATIONS PRIOR TO  ADMISSION:        Prior to Admission medications   Medication Sig Start Date End Date Taking? Authorizing Provider  Calcium Carbonate-Vitamin D (CALTRATE 600+D PO) Take 1 tablet by mouth 2 (two) times daily.    Historical Provider, MD  Coenzyme Q10 (CO Q10) 100 MG CAPS Take 100 mg by mouth daily.    Historical Provider, MD  diclofenac sodium (VOLTAREN) 1 % GEL Apply 1 application topically 3 (three) times daily as needed (knee pain).  06/11/16   Historical Provider, MD    diphenhydrAMINE (BENADRYL) 25 MG tablet Take 25 mg by mouth daily as needed for allergies.    Historical Provider, MD  lisinopril-hydrochlorothiazide (PRINZIDE,ZESTORETIC) 20-12.5 MG tablet Take 1 tablet by mouth daily at 12 noon.    Historical Provider, MD  metoprolol tartrate (LOPRESSOR) 25 MG tablet Take 12.5 mg by mouth 2 (two) times daily.     Historical Provider, MD  montelukast (SINGULAIR) 10 MG tablet Take 10 mg by mouth at bedtime.    Historical Provider, MD  traMADol (ULTRAM) 50 MG tablet Take 50 mg by mouth 2 (two) times daily as needed for pain. 06/11/16   Historical Provider, MD  tretinoin (RETIN-A) 0.05 % cream Apply 1 application topically every morning. Apply to face    Historical Provider, MD     ALLERGIES:        Allergies  Allergen Reactions  . Norco [Hydrocodone-Acetaminophen] Nausea And Vomiting and Anxiety  . Other Nausea And Vomiting and Anxiety    Darvocet    REVIEW OF SYSTEMS:  Review of Systems  Constitutional:       History of melanoma with resection 2006  HENT: Negative.   Eyes: Negative.   Respiratory: Negative.   Cardiovascular:       History of hypertension  Gastrointestinal: Negative.   Genitourinary:       Nocturia twice nightly  Musculoskeletal:       Osteopenia  Skin: Negative.   Neurological: Negative.   Endo/Heme/Allergies: Negative.   Psychiatric/Behavioral: Negative.     FAMILY HISTORY:  No family history on file.  SOCIAL HISTORY:   Social History      Occupational History  . Not on file.         Social History Main Topics  . Smoking status: Former Smoker    Packs/day: 2.00    Years: 20.00    Types: Cigarettes    Quit date: 07/10/1993  . Smokeless tobacco: Never Used  . Alcohol use Yes     Comment: occ wine  . Drug use: No  . Sexual activity: Not on file     EXAMINATION:  Vital signs in last 24 hours: BP 135/60   Pulse 60   Temp 98.4 F (36.9 C)   Resp 16   Ht 5' (1.524  m)   Wt 164 lb (74.4 kg)   BMI 32.03 kg/m   Physical Exam  Constitutional: She is oriented to person, place, and time. She appears well-developed and well-nourished.  HENT:  Head: Normocephalic and atraumatic.  Eyes: EOM are normal. Pupils are equal, round, and reactive to light.  Neck: Neck supple.  No carotid bruits  Cardiovascular: Normal rate, regular rhythm and intact distal pulses.   Murmur (rade 1-2 systoloc murmur) heard. Pulmonary/Chest: Effort normal and breath sounds normal.  Abdominal: Soft. Bowel sounds are normal. She exhibits no mass.  Musculoskeletal:       Right knee: She exhibits effusion (Trace).  Neurological: She is alert and oriented to  person, place, and time.  Skin: Skin is warm and dry.  Psychiatric: She has a normal mood and affect. Her behavior is normal. Judgment and thought content normal.   Right Knee Exam   Tenderness  The patient is experiencing tenderness in the medial joint line and lateral joint line.  Range of Motion  Extension: 5  Right knee flexion: 105.   Tests  Drawer:       Anterior - negative      Other  Sensation: normal Pulse: present Swelling: mild Other tests: effusion (Trace) present     Imaging Review Plain radiographs demonstrate moderate degenerative joint disease of the right knee. The overall alignment is mild varus. The bone quality appears to be good for age and reported activity level.  ASSESSMENT: End stage arthritis, right knee      Past Medical History:  Diagnosis Date  . Arthritis   . Cancer ([place])    melanoma back  . Complication of anesthesia    ? bp- passed out after in room.   Marland Kitchen Hypertension     PLAN: Plan for right total knee replacement.  The patient history, physical examination and imaging studies are consistent with advanced degenerative joint disease of the right knee. The patient has failed conservative treatment.  The clearance notes were reviewed.  After discussion  with the patient it was felt that Total Knee Replacement was indicated. The procedure,  risks, and benefits of total knee arthroplasty were presented and reviewed. The risks including but not limited to aseptic loosening, infection, blood clots, vascular and nerve injury, stiffness, patella tracking problems and fracture complications among others were discussed. The patient acknowledged the explanation, agreed to proceed with total knee replacement.  Mike Craze [place], [place] [phone]  09/13/2016 6:36 PM

## 2016-09-22 ENCOUNTER — Encounter (HOSPITAL_COMMUNITY): Payer: Self-pay | Admitting: *Deleted

## 2016-09-22 ENCOUNTER — Encounter (HOSPITAL_COMMUNITY)
Admission: RE | Disposition: A | Discharge status: Home Health | Payer: Self-pay | Source: Ambulatory Visit | Attending: Orthopaedic Surgery

## 2016-09-22 ENCOUNTER — Inpatient Hospital Stay (HOSPITAL_COMMUNITY): Payer: Medicare HMO | Admitting: Vascular Surgery

## 2016-09-22 ENCOUNTER — Inpatient Hospital Stay (HOSPITAL_COMMUNITY): Payer: Medicare HMO | Admitting: Certified Registered"

## 2016-09-22 ENCOUNTER — Inpatient Hospital Stay (HOSPITAL_COMMUNITY)
Admission: RE | Admit: 2016-09-22 | Discharge: 2016-09-24 | DRG: 470 | Disposition: A | Discharge status: Home Health | Payer: Medicare HMO | Source: Ambulatory Visit | Attending: Orthopaedic Surgery | Admitting: Orthopaedic Surgery

## 2016-09-22 DIAGNOSIS — D649 Anemia, unspecified: Secondary | ICD-10-CM | POA: Diagnosis not present

## 2016-09-22 DIAGNOSIS — Z96651 Presence of right artificial knee joint: Secondary | ICD-10-CM

## 2016-09-22 DIAGNOSIS — G8918 Other acute postprocedural pain: Secondary | ICD-10-CM | POA: Diagnosis not present

## 2016-09-22 DIAGNOSIS — Z87891 Personal history of nicotine dependence: Secondary | ICD-10-CM

## 2016-09-22 DIAGNOSIS — I10 Essential (primary) hypertension: Secondary | ICD-10-CM | POA: Diagnosis present

## 2016-09-22 DIAGNOSIS — Z6832 Body mass index (BMI) 32.0-32.9, adult: Secondary | ICD-10-CM | POA: Diagnosis not present

## 2016-09-22 DIAGNOSIS — M1712 Unilateral primary osteoarthritis, left knee: Secondary | ICD-10-CM | POA: Diagnosis not present

## 2016-09-22 DIAGNOSIS — Z85820 Personal history of malignant melanoma of skin: Secondary | ICD-10-CM | POA: Diagnosis not present

## 2016-09-22 DIAGNOSIS — M659 Synovitis and tenosynovitis, unspecified: Secondary | ICD-10-CM | POA: Diagnosis not present

## 2016-09-22 DIAGNOSIS — Z96652 Presence of left artificial knee joint: Secondary | ICD-10-CM

## 2016-09-22 DIAGNOSIS — M1611 Unilateral primary osteoarthritis, right hip: Secondary | ICD-10-CM | POA: Diagnosis not present

## 2016-09-22 DIAGNOSIS — M1711 Unilateral primary osteoarthritis, right knee: Principal | ICD-10-CM | POA: Diagnosis present

## 2016-09-22 DIAGNOSIS — M25561 Pain in right knee: Secondary | ICD-10-CM | POA: Diagnosis present

## 2016-09-22 HISTORY — PX: TOTAL KNEE ARTHROPLASTY: SHX125

## 2016-09-22 SURGERY — ARTHROPLASTY, KNEE, TOTAL
Anesthesia: General | Site: Knee | Laterality: Right

## 2016-09-22 MED ORDER — NEOSTIGMINE METHYLSULFATE 5 MG/5ML IV SOSY
PREFILLED_SYRINGE | INTRAVENOUS | Status: AC
  Filled 2016-09-22: qty 5

## 2016-09-22 MED ORDER — ACETAMINOPHEN 10 MG/ML IV SOLN
1000.0000 mg | Freq: Once | INTRAVENOUS | Status: AC
  Administered 2016-09-22: 1000 mg via INTRAVENOUS
  Filled 2016-09-22: qty 100

## 2016-09-22 MED ORDER — BUPIVACAINE-EPINEPHRINE 0.5% -1:200000 IJ SOLN
INTRAMUSCULAR | Status: DC | PRN
  Administered 2016-09-22: 30 mL

## 2016-09-22 MED ORDER — LACTATED RINGERS IV SOLN
INTRAVENOUS | Status: DC | PRN
  Administered 2016-09-22 (×2): via INTRAVENOUS

## 2016-09-22 MED ORDER — ROCURONIUM BROMIDE 100 MG/10ML IV SOLN
INTRAVENOUS | Status: DC | PRN
  Administered 2016-09-22: 50 mg via INTRAVENOUS

## 2016-09-22 MED ORDER — FENTANYL CITRATE (PF) 100 MCG/2ML IJ SOLN
INTRAMUSCULAR | Status: DC | PRN
  Administered 2016-09-22 (×4): 50 ug via INTRAVENOUS

## 2016-09-22 MED ORDER — SODIUM CHLORIDE 0.9 % IV SOLN: INTRAVENOUS | Status: DC

## 2016-09-22 MED ORDER — HYDROMORPHONE HCL 1 MG/ML IJ SOLN
INTRAMUSCULAR | Status: AC
  Filled 2016-09-22: qty 0.5

## 2016-09-22 MED ORDER — FENTANYL CITRATE (PF) 100 MCG/2ML IJ SOLN
INTRAMUSCULAR | Status: AC
  Filled 2016-09-22: qty 2

## 2016-09-22 MED ORDER — METOCLOPRAMIDE HCL 5 MG PO TABS
5.0000 mg | ORAL_TABLET | Freq: Three times a day (TID) | ORAL | Status: DC | PRN
Start: 2016-09-22 — End: 2016-09-24

## 2016-09-22 MED ORDER — LISINOPRIL-HYDROCHLOROTHIAZIDE 20-12.5 MG PO TABS: 1.0000 | ORAL_TABLET | Freq: Every day | ORAL | Status: DC

## 2016-09-22 MED ORDER — ONDANSETRON HCL 4 MG PO TABS: 4.0000 mg | ORAL_TABLET | Freq: Four times a day (QID) | ORAL | Status: DC | PRN

## 2016-09-22 MED ORDER — TRETINOIN 0.05 % EX CREA: 1.0000 "application " | TOPICAL_CREAM | CUTANEOUS | Status: DC

## 2016-09-22 MED ORDER — TRANEXAMIC ACID 1000 MG/10ML IV SOLN
INTRAVENOUS | Status: DC | PRN
  Administered 2016-09-22: 2000 mg via TOPICAL

## 2016-09-22 MED ORDER — LIDOCAINE HCL (CARDIAC) 20 MG/ML IV SOLN
INTRAVENOUS | Status: DC | PRN
  Administered 2016-09-22: 80 mg via INTRAVENOUS

## 2016-09-22 MED ORDER — ONDANSETRON HCL 4 MG/2ML IJ SOLN
4.0000 mg | Freq: Four times a day (QID) | INTRAMUSCULAR | Status: DC | PRN
  Administered 2016-09-22: 4 mg via INTRAVENOUS
  Filled 2016-09-22: qty 2

## 2016-09-22 MED ORDER — HYDROMORPHONE HCL 1 MG/ML IJ SOLN
INTRAMUSCULAR | Status: AC
  Filled 2016-09-22: qty 1

## 2016-09-22 MED ORDER — ESMOLOL HCL 100 MG/10ML IV SOLN
INTRAVENOUS | Status: DC | PRN
  Administered 2016-09-22: 10 mg via INTRAVENOUS

## 2016-09-22 MED ORDER — ESMOLOL HCL 100 MG/10ML IV SOLN
INTRAVENOUS | Status: AC
  Filled 2016-09-22: qty 10

## 2016-09-22 MED ORDER — ACETAMINOPHEN 10 MG/ML IV SOLN: 1000.0000 mg | Freq: Four times a day (QID) | INTRAVENOUS | Status: DC

## 2016-09-22 MED ORDER — HYDROMORPHONE HCL 1 MG/ML IJ SOLN
0.2500 mg | INTRAMUSCULAR | Status: DC | PRN
  Administered 2016-09-22 (×5): 0.5 mg via INTRAVENOUS

## 2016-09-22 MED ORDER — TRANEXAMIC ACID 1000 MG/10ML IV SOLN
2000.0000 mg | INTRAVENOUS | Status: DC
  Filled 2016-09-22: qty 20

## 2016-09-22 MED ORDER — KETOROLAC TROMETHAMINE 15 MG/ML IJ SOLN
INTRAMUSCULAR | Status: AC
  Filled 2016-09-22: qty 1

## 2016-09-22 MED ORDER — GLYCOPYRROLATE 0.2 MG/ML IJ SOLN
INTRAMUSCULAR | Status: DC | PRN
  Administered 2016-09-22: 0.6 mg via INTRAVENOUS

## 2016-09-22 MED ORDER — CEFAZOLIN SODIUM-DEXTROSE 2-4 GM/100ML-% IV SOLN
2.0000 g | INTRAVENOUS | Status: AC
  Administered 2016-09-22: 2 g via INTRAVENOUS
  Filled 2016-09-22: qty 100

## 2016-09-22 MED ORDER — BUPIVACAINE-EPINEPHRINE (PF) 0.5% -1:200000 IJ SOLN
INTRAMUSCULAR | Status: AC
  Filled 2016-09-22: qty 30

## 2016-09-22 MED ORDER — 0.9 % SODIUM CHLORIDE (POUR BTL) OPTIME
TOPICAL | Status: DC | PRN
  Administered 2016-09-22: 1000 mL

## 2016-09-22 MED ORDER — DIAZEPAM 5 MG PO TABS
2.5000 mg | ORAL_TABLET | Freq: Once | ORAL | Status: AC
Start: 2016-09-22 — End: 2016-09-22
  Administered 2016-09-22: 2.5 mg via ORAL
  Filled 2016-09-22: qty 1

## 2016-09-22 MED ORDER — ROPIVACAINE HCL 7.5 MG/ML IJ SOLN
INTRAMUSCULAR | Status: DC | PRN
  Administered 2016-09-22: 20 mL via PERINEURAL

## 2016-09-22 MED ORDER — EPHEDRINE 5 MG/ML INJ
INTRAVENOUS | Status: AC
  Filled 2016-09-22: qty 10

## 2016-09-22 MED ORDER — ALUM & MAG HYDROXIDE-SIMETH 200-200-20 MG/5ML PO SUSP: 30.0000 mL | ORAL | Status: DC | PRN

## 2016-09-22 MED ORDER — POLYETHYLENE GLYCOL 3350 17 G PO PACK: 17.0000 g | PACK | Freq: Every day | ORAL | Status: DC | PRN

## 2016-09-22 MED ORDER — DEXAMETHASONE SODIUM PHOSPHATE 10 MG/ML IJ SOLN
INTRAMUSCULAR | Status: DC | PRN
  Administered 2016-09-22: 10 mg via INTRAVENOUS

## 2016-09-22 MED ORDER — ROCURONIUM BROMIDE 50 MG/5ML IV SOSY
PREFILLED_SYRINGE | INTRAVENOUS | Status: AC
  Filled 2016-09-22: qty 5

## 2016-09-22 MED ORDER — MAGNESIUM CITRATE PO SOLN: 1.0000 | Freq: Once | ORAL | Status: DC | PRN

## 2016-09-22 MED ORDER — ONDANSETRON HCL 4 MG/2ML IJ SOLN
INTRAMUSCULAR | Status: DC | PRN
  Administered 2016-09-22: 4 mg via INTRAVENOUS

## 2016-09-22 MED ORDER — DEXAMETHASONE SODIUM PHOSPHATE 10 MG/ML IJ SOLN
INTRAMUSCULAR | Status: AC
  Filled 2016-09-22: qty 1

## 2016-09-22 MED ORDER — METHOCARBAMOL 500 MG PO TABS
500.0000 mg | ORAL_TABLET | Freq: Four times a day (QID) | ORAL | Status: DC | PRN
  Administered 2016-09-22 – 2016-09-24 (×9): 500 mg via ORAL
  Filled 2016-09-22 (×8): qty 1

## 2016-09-22 MED ORDER — CEFAZOLIN SODIUM-DEXTROSE 2-4 GM/100ML-% IV SOLN
2.0000 g | Freq: Four times a day (QID) | INTRAVENOUS | Status: AC
  Administered 2016-09-22 (×2): 2 g via INTRAVENOUS
  Filled 2016-09-22 (×2): qty 100

## 2016-09-22 MED ORDER — PROPOFOL 10 MG/ML IV BOLUS
INTRAVENOUS | Status: DC | PRN
Start: 2016-09-22 — End: 2016-09-22
  Administered 2016-09-22: 150 mg via INTRAVENOUS

## 2016-09-22 MED ORDER — DOCUSATE SODIUM 100 MG PO CAPS
100.0000 mg | ORAL_CAPSULE | Freq: Two times a day (BID) | ORAL | Status: DC
  Administered 2016-09-22 – 2016-09-24 (×5): 100 mg via ORAL
  Filled 2016-09-22 (×5): qty 1

## 2016-09-22 MED ORDER — BISACODYL 10 MG RE SUPP: 10.0000 mg | Freq: Every day | RECTAL | Status: DC | PRN

## 2016-09-22 MED ORDER — PROMETHAZINE HCL 25 MG/ML IJ SOLN: 6.2500 mg | INTRAMUSCULAR | Status: DC | PRN

## 2016-09-22 MED ORDER — HYDROCHLOROTHIAZIDE 12.5 MG PO CAPS
12.5000 mg | ORAL_CAPSULE | Freq: Every day | ORAL | Status: DC
  Administered 2016-09-22 – 2016-09-24 (×3): 12.5 mg via ORAL
  Filled 2016-09-22 (×3): qty 1

## 2016-09-22 MED ORDER — KETOROLAC TROMETHAMINE 30 MG/ML IJ SOLN
INTRAMUSCULAR | Status: DC | PRN
  Administered 2016-09-22: 30 mg via INTRAVENOUS

## 2016-09-22 MED ORDER — RIVAROXABAN 10 MG PO TABS
10.0000 mg | ORAL_TABLET | Freq: Every day | ORAL | Status: DC
  Administered 2016-09-23 – 2016-09-24 (×2): 10 mg via ORAL
  Filled 2016-09-22 (×2): qty 1

## 2016-09-22 MED ORDER — OXYCODONE HCL 5 MG PO TABS
5.0000 mg | ORAL_TABLET | ORAL | Status: DC | PRN
  Administered 2016-09-22 – 2016-09-24 (×15): 10 mg via ORAL
  Filled 2016-09-22 (×14): qty 2

## 2016-09-22 MED ORDER — DIPHENHYDRAMINE HCL 12.5 MG/5ML PO ELIX: 12.5000 mg | ORAL_SOLUTION | ORAL | Status: DC | PRN

## 2016-09-22 MED ORDER — PHENOL 1.4 % MT LIQD: 1.0000 | OROMUCOSAL | Status: DC | PRN

## 2016-09-22 MED ORDER — PHENYLEPHRINE 40 MCG/ML (10ML) SYRINGE FOR IV PUSH (FOR BLOOD PRESSURE SUPPORT)
PREFILLED_SYRINGE | INTRAVENOUS | Status: AC
  Filled 2016-09-22: qty 10

## 2016-09-22 MED ORDER — OXYCODONE HCL 5 MG PO TABS
ORAL_TABLET | ORAL | Status: AC
  Filled 2016-09-22: qty 2

## 2016-09-22 MED ORDER — METHOCARBAMOL 500 MG PO TABS
ORAL_TABLET | ORAL | Status: AC
  Filled 2016-09-22: qty 1

## 2016-09-22 MED ORDER — LISINOPRIL 20 MG PO TABS
20.0000 mg | ORAL_TABLET | Freq: Every day | ORAL | Status: DC
  Administered 2016-09-22 – 2016-09-23 (×2): 20 mg via ORAL
  Filled 2016-09-22 (×3): qty 1

## 2016-09-22 MED ORDER — SUCCINYLCHOLINE CHLORIDE 200 MG/10ML IV SOSY
PREFILLED_SYRINGE | INTRAVENOUS | Status: AC
  Filled 2016-09-22: qty 10

## 2016-09-22 MED ORDER — MONTELUKAST SODIUM 10 MG PO TABS: 10.0000 mg | ORAL_TABLET | Freq: Every day | ORAL | Status: DC

## 2016-09-22 MED ORDER — CHLORHEXIDINE GLUCONATE 4 % EX LIQD: 60.0000 mL | Freq: Once | CUTANEOUS | Status: DC

## 2016-09-22 MED ORDER — ACETAMINOPHEN 10 MG/ML IV SOLN
1000.0000 mg | Freq: Four times a day (QID) | INTRAVENOUS | Status: AC
  Administered 2016-09-22 – 2016-09-23 (×4): 1000 mg via INTRAVENOUS
  Filled 2016-09-22 (×4): qty 100

## 2016-09-22 MED ORDER — MIDAZOLAM HCL 2 MG/2ML IJ SOLN
INTRAMUSCULAR | Status: AC
  Filled 2016-09-22: qty 2

## 2016-09-22 MED ORDER — KETOROLAC TROMETHAMINE 15 MG/ML IJ SOLN
7.5000 mg | Freq: Four times a day (QID) | INTRAMUSCULAR | Status: AC
  Administered 2016-09-22 – 2016-09-23 (×4): 7.5 mg via INTRAVENOUS
  Filled 2016-09-22 (×2): qty 1

## 2016-09-22 MED ORDER — SODIUM CHLORIDE 0.9 % IR SOLN
Status: DC | PRN
  Administered 2016-09-22: 3000 mL

## 2016-09-22 MED ORDER — SCOPOLAMINE 1 MG/3DAYS TD PT72
MEDICATED_PATCH | TRANSDERMAL | Status: AC
  Filled 2016-09-22: qty 1

## 2016-09-22 MED ORDER — PROPOFOL 10 MG/ML IV BOLUS
INTRAVENOUS | Status: AC
  Filled 2016-09-22: qty 20

## 2016-09-22 MED ORDER — HYDROMORPHONE HCL 2 MG/ML IJ SOLN
0.5000 mg | INTRAMUSCULAR | Status: DC | PRN
  Administered 2016-09-22: 0.5 mg via INTRAVENOUS
  Filled 2016-09-22 (×2): qty 1

## 2016-09-22 MED ORDER — LIDOCAINE 2% (20 MG/ML) 5 ML SYRINGE
INTRAMUSCULAR | Status: AC
  Filled 2016-09-22: qty 5

## 2016-09-22 MED ORDER — MENTHOL 3 MG MT LOZG
1.0000 | LOZENGE | OROMUCOSAL | Status: DC | PRN
  Filled 2016-09-22: qty 9

## 2016-09-22 MED ORDER — ONDANSETRON HCL 4 MG/2ML IJ SOLN
INTRAMUSCULAR | Status: AC
  Filled 2016-09-22: qty 2

## 2016-09-22 MED ORDER — MIDAZOLAM HCL 5 MG/5ML IJ SOLN
INTRAMUSCULAR | Status: DC | PRN
  Administered 2016-09-22 (×2): 1 mg via INTRAVENOUS

## 2016-09-22 MED ORDER — METOPROLOL TARTRATE 12.5 MG HALF TABLET
12.5000 mg | ORAL_TABLET | Freq: Two times a day (BID) | ORAL | Status: DC
  Administered 2016-09-22 – 2016-09-24 (×4): 12.5 mg via ORAL
  Filled 2016-09-22 (×4): qty 1

## 2016-09-22 MED ORDER — SODIUM CHLORIDE 0.9 % IV SOLN
INTRAVENOUS | Status: DC
  Administered 2016-09-22: 75 mL/h via INTRAVENOUS

## 2016-09-22 MED ORDER — METHOCARBAMOL 1000 MG/10ML IJ SOLN
500.0000 mg | Freq: Four times a day (QID) | INTRAVENOUS | Status: DC | PRN
  Filled 2016-09-22: qty 5

## 2016-09-22 MED ORDER — CALCIUM CARBONATE-VITAMIN D 500-200 MG-UNIT PO TABS
1.0000 | ORAL_TABLET | Freq: Two times a day (BID) | ORAL | Status: DC
  Administered 2016-09-22 – 2016-09-24 (×5): 1 via ORAL
  Filled 2016-09-22 (×7): qty 1

## 2016-09-22 MED ORDER — NEOSTIGMINE METHYLSULFATE 10 MG/10ML IV SOLN
INTRAVENOUS | Status: DC | PRN
  Administered 2016-09-22: 4 mg via INTRAVENOUS

## 2016-09-22 MED ORDER — KETOROLAC TROMETHAMINE 30 MG/ML IJ SOLN
INTRAMUSCULAR | Status: AC
  Filled 2016-09-22: qty 1

## 2016-09-22 MED ORDER — METOCLOPRAMIDE HCL 5 MG/ML IJ SOLN: 5.0000 mg | Freq: Three times a day (TID) | INTRAMUSCULAR | Status: DC | PRN

## 2016-09-22 SURGICAL SUPPLY — 65 items
BAG DECANTER FOR FLEXI CONT (MISCELLANEOUS) ×2 IMPLANT
BANDAGE ELASTIC 4 VELCRO ST LF (GAUZE/BANDAGES/DRESSINGS) ×1 IMPLANT
BANDAGE ELASTIC 6 VELCRO ST LF (GAUZE/BANDAGES/DRESSINGS) ×1 IMPLANT
BANDAGE ESMARK 6X9 LF (GAUZE/BANDAGES/DRESSINGS) ×1 IMPLANT
BLADE SAGITTAL 25.0X1.19X90 (BLADE) ×2 IMPLANT
BNDG CMPR 9X6 STRL LF SNTH (GAUZE/BANDAGES/DRESSINGS) ×1
BNDG ESMARK 6X9 LF (GAUZE/BANDAGES/DRESSINGS) ×2
BOWL SMART MIX CTS (DISPOSABLE) ×2 IMPLANT
CAP KNEE TOTAL 3 SIGMA ×1 IMPLANT
CEMENT HV SMART SET (Cement) ×4 IMPLANT
COVER SURGICAL LIGHT HANDLE (MISCELLANEOUS) ×2 IMPLANT
CUFF TOURNIQUET SINGLE 34IN LL (TOURNIQUET CUFF) ×1 IMPLANT
CUFF TOURNIQUET SINGLE 44IN (TOURNIQUET CUFF) IMPLANT
DECANTER SPIKE VIAL GLASS SM (MISCELLANEOUS) ×2 IMPLANT
DRAPE EXTREMITY T 121X128X90 (DRAPE) IMPLANT
DRAPE HALF SHEET 40X57 (DRAPES) ×2 IMPLANT
DRAPE PROXIMA HALF (DRAPES) ×2 IMPLANT
DRSG ADAPTIC 3X8 NADH LF (GAUZE/BANDAGES/DRESSINGS) ×2 IMPLANT
DRSG PAD ABDOMINAL 8X10 ST (GAUZE/BANDAGES/DRESSINGS) ×4 IMPLANT
DURAPREP 26ML APPLICATOR (WOUND CARE) ×4 IMPLANT
ELECT CAUTERY BLADE 6.4 (BLADE) ×2 IMPLANT
ELECT REM PT RETURN 9FT ADLT (ELECTROSURGICAL) ×2
ELECTRODE REM PT RTRN 9FT ADLT (ELECTROSURGICAL) ×1 IMPLANT
EVACUATOR 1/8 PVC DRAIN (DRAIN) ×1 IMPLANT
FACESHIELD WRAPAROUND (MASK) ×4 IMPLANT
FACESHIELD WRAPAROUND OR TEAM (MASK) ×2 IMPLANT
GAUZE SPONGE 4X4 12PLY STRL (GAUZE/BANDAGES/DRESSINGS) ×2 IMPLANT
GLOVE BIOGEL PI IND STRL 8 (GLOVE) ×1 IMPLANT
GLOVE BIOGEL PI IND STRL 8.5 (GLOVE) ×1 IMPLANT
GLOVE BIOGEL PI INDICATOR 8 (GLOVE) ×1
GLOVE BIOGEL PI INDICATOR 8.5 (GLOVE) ×1
GLOVE ECLIPSE 8.0 STRL XLNG CF (GLOVE) ×4 IMPLANT
GLOVE SURG ORTHO 8.5 STRL (GLOVE) ×4 IMPLANT
GOWN STRL REUS W/ TWL LRG LVL3 (GOWN DISPOSABLE) ×2 IMPLANT
GOWN STRL REUS W/TWL 2XL LVL3 (GOWN DISPOSABLE) ×2 IMPLANT
GOWN STRL REUS W/TWL LRG LVL3 (GOWN DISPOSABLE) ×4
HANDPIECE INTERPULSE COAX TIP (DISPOSABLE) ×2
KIT BASIN OR (CUSTOM PROCEDURE TRAY) ×2 IMPLANT
KIT ROOM TURNOVER OR (KITS) ×2 IMPLANT
MANIFOLD NEPTUNE II (INSTRUMENTS) ×2 IMPLANT
NEEDLE 22X1 1/2 (OR ONLY) (NEEDLE) ×2 IMPLANT
NS IRRIG 1000ML POUR BTL (IV SOLUTION) ×2 IMPLANT
PACK TOTAL JOINT (CUSTOM PROCEDURE TRAY) ×2 IMPLANT
PAD ARMBOARD 7.5X6 YLW CONV (MISCELLANEOUS) ×4 IMPLANT
PAD CAST 4YDX4 CTTN HI CHSV (CAST SUPPLIES) ×1 IMPLANT
PADDING CAST COTTON 4X4 STRL (CAST SUPPLIES) ×2
PADDING CAST COTTON 6X4 STRL (CAST SUPPLIES) ×2 IMPLANT
RUBBERBAND STERILE (MISCELLANEOUS) ×1 IMPLANT
SET HNDPC FAN SPRY TIP SCT (DISPOSABLE) ×1 IMPLANT
STAPLER VISISTAT 35W (STAPLE) ×2 IMPLANT
SUCTION FRAZIER HANDLE 10FR (MISCELLANEOUS) ×1
SUCTION TUBE FRAZIER 10FR DISP (MISCELLANEOUS) ×1 IMPLANT
SURGIFLO W/THROMBIN 8M KIT (HEMOSTASIS) IMPLANT
SUT BONE WAX W31G (SUTURE) ×2 IMPLANT
SUT ETHIBOND NAB CT1 #1 30IN (SUTURE) ×4 IMPLANT
SUT MNCRL AB 3-0 PS2 18 (SUTURE) ×2 IMPLANT
SUT VIC AB 0 CT1 27 (SUTURE) ×2
SUT VIC AB 0 CT1 27XBRD ANBCTR (SUTURE) ×1 IMPLANT
SUT VIC AB 2-0 CT1 27 (SUTURE) ×2
SUT VIC AB 2-0 CT1 TAPERPNT 27 (SUTURE) IMPLANT
SYR CONTROL 10ML LL (SYRINGE) IMPLANT
TOWEL OR 17X24 6PK STRL BLUE (TOWEL DISPOSABLE) ×2 IMPLANT
TOWEL OR 17X26 10 PK STRL BLUE (TOWEL DISPOSABLE) ×2 IMPLANT
TRAY FOLEY CATH 14FRSI W/METER (CATHETERS) ×1 IMPLANT
WRAP KNEE MAXI GEL POST OP (GAUZE/BANDAGES/DRESSINGS) ×2 IMPLANT

## 2016-09-22 NOTE — Transfer of Care (Signed)
Immediate Anesthesia Transfer of Care Note  Patient: [name]  Procedure(s) Performed: Procedure(s): RIGHT TOTAL KNEE ARTHROPLASTY (Right)  Patient Location: PACU  Anesthesia Type:General  Level of Consciousness: awake, alert , oriented and patient cooperative  Airway & Oxygen Therapy: Patient Spontanous Breathing and Patient connected to nasal cannula oxygen  Post-op Assessment: Report given to RN and Post -op Vital signs reviewed and stable  Post vital signs: Reviewed  Last Vitals: 138/79, 76, 19, 95% Vitals:   09/22/16 0605  BP: (!) 131/49  Pulse: (!) 52  Resp: 20  Temp: 36.6 C    Last Pain:  Vitals:   09/22/16 0605  TempSrc: Oral      Patients Stated Pain Goal: 8 ([id number])  Complications: No apparent anesthesia complications

## 2016-09-22 NOTE — Op Note (Signed)
PATIENT ID:      Yarelie Burky  MRN:     [id number] DOB/AGE:    03-12-1951 / 66 y.o.       OPERATIVE REPORT    DATE OF PROCEDURE:  09/22/2016       PREOPERATIVE DIAGNOSIS:END STAGE   RIGHT KNEE OSTEOARTHRITIS                                                       Estimated body mass index is 32.03 kg/m as calculated from the following:   Height as of 09/09/16: 5' (1.524 m).   Weight as of 09/09/16: 164 lb (74.4 kg).     POSTOPERATIVE DIAGNOSISEND STAGE  RIGHT KNEE OSTEOARTHRITIS                                                                     Estimated body mass index is 32.03 kg/m as calculated from the following:   Height as of 09/09/16: 5' (1.524 m).   Weight as of 09/09/16: 164 lb (74.4 kg).     PROCEDURE:  Procedure(s): RIGHT TOTAL KNEE ARTHROPLASTY      SURGEON:  [name], MD    ASSISTANT:   [name], PA-C   (Present and scrubbed throughout the case, critical for assistance with exposure, retraction, instrumentation, and closure.)          ANESTHESIA: regional and general     DRAINS: hemovac, clamped in right knee     TOURNIQUET TIME:  Total Tourniquet Time Documented: Thigh (Right) - 61 minutes Total: Thigh (Right) - 61 minutes     COMPLICATIONS:  None   CONDITION:  stable  PROCEDURE IN DETAIL: [place] 09/22/2016, 9:08 AM

## 2016-09-22 NOTE — Op Note (Signed)
[name], [name]             ACCOUNT NO.:  [id number]  MEDICAL RECORD NO.:  [id number]  LOCATION:                                 FACILITY:  PHYSICIAN:  [name]. Yarimar Lavis, M.D.DATE OF BIRTH:  1950/12/03  DATE OF PROCEDURE:  09/22/2016 DATE OF DISCHARGE:                              OPERATIVE REPORT   PREOPERATIVE DIAGNOSIS:  End-stage osteoarthritis, right knee.  POSTOPERATIVE DIAGNOSIS:  End-stage osteoarthritis, right knee.  PROCEDURE:  Right total knee replacement.  SURGEON:  [name]. [name], M.D.  ASSISTANT:  Aaron Edelman D. Petrarca, PA-C.  He was present throughout the operative procedure to ensure its timely completion.  ANESTHESIA:  General with adductor canal block.  COMPLICATIONS:  None.  COMPONENTS:  DePuy LCS standard femoral component, #2.5 rotating keeled tibial tray with a 10-mm polyethylene bridging bearing and metal-backed 3-peg rotating patella.  Components were secured with polymethyl methacrylate.  DESCRIPTION OF PROCEDURE:  Ms. Boulos was met in the holding area, identified the right knee as the appropriate operative site, and marked it accordingly.  She did receive a preoperative adductor canal block per Anesthesia.  The patient was then transported to room #7 and placed under general anesthesia without difficulty.  Nursing staff inserted a Foley catheter. Urine was clear.  The right lower extremity was then placed in a thigh tourniquet.  Leg was then prepped with chlorhexidine scrub and DuraPrep x2 from the tourniquet to the tips of the toes.  Sterile draping was performed.  Time-out was called.  The extremity was elevated and Esmarch exsanguinated with a proximal tourniquet at 350 mmHg.  A midline longitudinal incision was made centered about the patella extending from the superior pouch to the tibial tubercle.  Via sharp dissection, incision was carried down to subcutaneous tissue.  First layer of capsule was incised in the midline and  medial parapatellar incision was then made with the Bovie.  Joint was entered, there was minimal clear yellow joint effusion.  The patella was everted 180 degrees laterally and the knee flexed to 90 degrees.  There was a moderate amount of beefy red synovitis.  Synovectomy was performed. There were large osteophytes along the medial and lateral femoral condyle and medial tibial plateau.  These were removed.  I measured a standard femoral component.  There were large areas of complete articular cartilage loss in all 3 compartments.  First, bony cut was then made transversely in the proximal tibia with a 7-degree angle of declination.  After each bony cut on the tibia and the femur, I checked my alignment with the external tibial guide.  Subsequent cuts were then made on the femur using the standard femoral jig.  I used a 4-degree distal femoral valgus cut.  Flexion and extension gaps were perfectly symmetrical at 10 mm.  MCL and LCL remained intact throughout the procedure.  Lamina spreaders were inserted into the medial and lateral compartment.  For better visualization, I did remove the medial and lateral meniscus as well as the ACL and PCL.  Osteophytes were removed from the posterior femoral condyles with a curved 3/4 inch osteotome.  There were no loose bodies identified.  Finishing guide was then applied to the femur to  obtain the tapered cuts and the center hole.  As mentioned, I did use a 4-degree distal femoral valgus cut.  Retractors were then placed about the tibia, it was advanced anteriorly and measured 2.5 tibial tray.  This was pinned in place.  Center hole was made followed by the keeled cut.  With the trial tibial jig in place, the 10-mm polyethylene bridging bearing was applied.  The trial standard femoral component was applied.  The entire construct was reduced.  Through a full range of motion, we had excellent rotation of the tibial tray and full extension and  flexion well beyond 120 degrees and no opening with varus or valgus stress.  Patella was prepared by removing approximately 8 mm of bone leaving 13 mm of patella thickness.  Trial patella was inserted and reduced and through a full range of motion, it remained perfectly stable.  Trial components were removed.  Joint was copiously irrigated with saline solution.  The final components were then impacted with polymethyl methacrylate. Initially applied the 2.5 tibial tray followed by the 10-mm polyethylene bridging bearing and the standard femoral component.  The joint was reduced and extraneous methacrylate was removed from the periphery of the components.  The patella was then applied with methacrylate and a patellar clamp.  At approximately 16 minutes, the methacrylate had matured during which time, we injected the joint with 0.25% Marcaine with epinephrine.  Tourniquet was then deflated at 61 minutes.  The joint was again irrigated.  Gross bleeders were Bovie coagulated.  We applied topical tranexamic acid for about 5 minutes.  We then Bovie coagulated any further bleeding and we had a nice dry field.  A medium-size Hemovac was placed in the lateral compartment.  The deep capsule was then closed with a running #1 Ethibond, superficial capsule with a running 0-Vicryl, subcu with 3-0 Monocryl, skin closed with skin clips.  Sterile bulky dressing was applied followed by the patient's support stocking.  The patient tolerated the procedure without complications.     [name]. [name], M.D.   ______________________________ [name]. [name], M.D.    PWW/MEDQ  D:  09/22/2016  T:  09/22/2016  Job:  [id number]

## 2016-09-22 NOTE — H&P (Signed)
The recent History & Physical has been reviewed. I have personally examined the patient today. There is no interval change to the documented History & Physical. The patient would like to proceed with the procedure.  Garald Balding 09/22/2016,  7:10 AM

## 2016-09-22 NOTE — Anesthesia Procedure Notes (Signed)
Anesthesia Regional Block:  Adductor canal block  Pre-Anesthetic Checklist: ,, timeout performed, Correct Patient, Correct Site, Correct Laterality, Correct Procedure, Correct Position, site marked, Risks and benefits discussed,  Surgical consent,  Pre-op evaluation,  At surgeon's request and post-op pain management  Laterality: Right  Prep: chloraprep       Needles:   Needle Type: Echogenic Stimulator Needle     Needle Length: 9cm 9 cm Needle Gauge: 21 and 21 G  Needle insertion depth: 5 cm   Additional Needles:  Procedures: ultrasound guided (picture in chart) Adductor canal block Narrative:  Start time: 09/22/2016 6:45 AM End time: 09/22/2016 7:00 AM Injection made incrementally with aspirations every 5 mL.  Performed by: Personally  Anesthesiologist: Vivion Romano

## 2016-09-22 NOTE — Discharge Instructions (Signed)
Information on my medicine - XARELTO® (Rivaroxaban) ° °This medication education was reviewed with me or my healthcare representative as part of my discharge preparation.  The pharmacist that spoke with me during my hospital stay was:  Julie Nay Brown, RPH ° °Why was Xarelto® prescribed for you? °Xarelto® was prescribed for you to reduce the risk of blood clots forming after orthopedic surgery. The medical term for these abnormal blood clots is venous thromboembolism (VTE). ° °What do you need to know about xarelto® ? °Take your Xarelto® ONCE DAILY at the same time every day. °You may take it either with or without food. ° °If you have difficulty swallowing the tablet whole, you may crush it and mix in applesauce just prior to taking your dose. ° °Take Xarelto® exactly as prescribed by your doctor and DO NOT stop taking Xarelto® without talking to the doctor who prescribed the medication.  Stopping without other VTE prevention medication to take the place of Xarelto® may increase your risk of developing a clot. ° °After discharge, you should have regular check-up appointments with your healthcare provider that is prescribing your Xarelto®.   ° °What do you do if you miss a dose? °If you miss a dose, take it as soon as you remember on the same day then continue your regularly scheduled once daily regimen the next day. Do not take two doses of Xarelto® on the same day.  ° °Important Safety Information °A possible side effect of Xarelto® is bleeding. You should call your healthcare provider right away if you experience any of the following: °? Bleeding from an injury or your nose that does not stop. °? Unusual colored urine (red or dark brown) or unusual colored stools (red or black). °? Unusual bruising for unknown reasons. °? A serious fall or if you hit your head (even if there is no bleeding). ° °Some medicines may interact with Xarelto® and might increase your risk of bleeding while on Xarelto®. To help  avoid this, consult your healthcare provider or pharmacist prior to using any new prescription or non-prescription medications, including herbals, vitamins, non-steroidal anti-inflammatory drugs (NSAIDs) and supplements. ° °This website has more information on Xarelto®: www.xarelto.com. ° ° ° °

## 2016-09-22 NOTE — Anesthesia Postprocedure Evaluation (Addendum)
Anesthesia Post Note  Patient: [name]  Procedure(s) Performed: Procedure(s) (LRB): RIGHT TOTAL KNEE ARTHROPLASTY (Right)  Patient location during evaluation: PACU Anesthesia Type: General Level of consciousness: awake and alert Pain management: pain level controlled Vital Signs Assessment: post-procedure vital signs reviewed and stable Respiratory status: spontaneous breathing, nonlabored ventilation, respiratory function stable and patient connected to nasal cannula oxygen Cardiovascular status: blood pressure returned to baseline and stable Postop Assessment: no signs of nausea or vomiting Anesthetic complications: no       Last Vitals:  Vitals:   09/22/16 1015 09/22/16 1030  BP:    Pulse: (!) 58 65  Resp: 12 16  Temp:      Last Pain:  Vitals:   09/22/16 1030  TempSrc:   PainSc: 7                  Friedrich Harriott,JAMES TERRILL

## 2016-09-22 NOTE — Anesthesia Procedure Notes (Addendum)
Procedure Name: Intubation Date/Time: 09/22/2016 7:24 AM Performed by: Marchelle Folks ANN Pre-anesthesia Checklist: Patient identified, Emergency Drugs available, Suction available, Patient being monitored and Timeout performed Patient Re-evaluated:Patient Re-evaluated prior to inductionOxygen Delivery Method: Circle system utilized Preoxygenation: Pre-oxygenation with 100% oxygen Intubation Type: IV induction Ventilation: Oral airway inserted - appropriate to patient size and Mask ventilation without difficulty Laryngoscope Size: Mac and 3 Grade View: Grade I Tube size: 7.0 mm Number of attempts: 1 Airway Equipment and Method: Stylet and Oral airway Secured at: 22 cm Tube secured with: Tape Dental Injury: Teeth and Oropharynx as per pre-operative assessment

## 2016-09-22 NOTE — Progress Notes (Signed)
Care of pt assumed by MA Mikeya Tomasetti RN 

## 2016-09-22 NOTE — Evaluation (Signed)
Physical Therapy Evaluation Patient Details Name: [name] MRN: [id number] DOB: [birth year] Today's Date: 09/22/2016   History of Present Illness  Admitted for RTKA, 50% PWB;  has a past medical history of Arthritis; Cancer ([place]); Complication of anesthesia; Hypertension; and PONV (postoperative nausea and vomiting).  has a pertinent past surgical history that includes  Hand surgery (Right, 06); Knee arthroscopy (Right, 10); and Total knee arthroplasty (Left, 09/19/2013).  Clinical Impression   Pt is s/p TKA resulting in the deficits listed below (see PT Problem List).  Pt will benefit from skilled PT to increase their independence and safety with mobility to allow discharge to the venue listed below.      Follow Up Recommendations Home health PT;Supervision/Assistance - 24 hour    Equipment Recommendations  Rolling walker with 5" wheels;3in1 (PT) (Youth-sized)    Recommendations for Other Services OT consult     Precautions / Restrictions Precautions Precautions: Fall Restrictions Weight Bearing Restrictions: Yes RLE Weight Bearing: Partial weight bearing RLE Partial Weight Bearing Percentage or Pounds: 50  Pt educated to not allow any pillow or bolster under knee for healing with optimal range of motion.     Mobility  Bed Mobility Overal bed mobility: Needs Assistance Bed Mobility: Supine to Sit     Supine to sit: Min guard     General bed mobility comments: Took a lot of extra time as pt was afraid of passing out once sitting up; started with HOB quite elevated, then turned to let feet to floor  Transfers Overall transfer level: Needs assistance Equipment used: Rolling walker (2 wheeled) Transfers: Sit to/from Stand Sit to Stand: Min assist         General transfer comment: Min assist to steady; cues for hand placement and sfaety  Ambulation/Gait Ambulation/Gait assistance: Min guard Ambulation Distance (Feet):  (pivotal steps bed to recliner) Assistive  device: Rolling walker (2 wheeled) Gait Pattern/deviations: Step-to pattern     General Gait Details: Cues for gait sequence and to self-monitor for activity tolerance  Stairs            Wheelchair Mobility    Modified Rankin (Stroke Patients Only)       Balance                                             Pertinent Vitals/Pain Pain Assessment: 0-10 Pain Score: 3  Pain Location: R knee Pain Descriptors / Indicators: Aching Pain Intervention(s): Monitored during session    Home Living Family/patient expects to be discharged to:: Private residence Living Arrangements: Spouse/significant other Available Help at Discharge: Family;Available 24 hours/day Type of Home: House Home Access: Stairs to enter Entrance Stairs-Rails: None Entrance Stairs-Number of Steps: 2 Home Layout: One level Home Equipment: Grab bars - tub/shower;Walker - 2 wheels;Shower seat - built in      Prior Function Level of Independence: Independent               Hand Dominance   Dominant Hand: Right    Extremity/Trunk Assessment   Upper Extremity Assessment Upper Extremity Assessment: Overall WFL for tasks assessed    Lower Extremity Assessment Lower Extremity Assessment: RLE deficits/detail RLE Deficits / Details: Grossly decr AROM and strength, limtied by pain postop; Good knee control with straight leg raise with minimal quad lag       Communication   Communication: No difficulties  Cognition  Arousal/Alertness: Awake/alert Behavior During Therapy: WFL for tasks assessed/performed Overall Cognitive Status: Within Functional Limits for tasks assessed                      General Comments General comments (skin integrity, edema, etc.): Took extra time as pt was quite nervous about passing out; serial BPs taken, all stable    Exercises Total Joint Exercises Ankle Circles/Pumps: AROM;Both;10 reps Quad Sets: AROM;Right;10 reps Heel Slides:  AROM;Right;10 reps Straight Leg Raises: AROM;Right;10 reps   Assessment/Plan    PT Assessment Patient needs continued PT services  PT Problem List Decreased strength;Decreased range of motion;Decreased activity tolerance;Decreased balance;Decreased mobility;Decreased knowledge of use of DME;Decreased knowledge of precautions;Pain          PT Treatment Interventions DME instruction;Gait training;Stair training;Functional mobility training;Therapeutic activities;Therapeutic exercise;Patient/family education    PT Goals (Current goals can be found in the Care Plan section)  Acute Rehab PT Goals Patient Stated Goal: walk nature trails PT Goal Formulation: With patient Time For Goal Achievement: 09/29/16 Potential to Achieve Goals: Good    Frequency 7X/week   Barriers to discharge        Co-evaluation               End of Session Equipment Utilized During Treatment: Gait belt Activity Tolerance: Patient tolerated treatment well Patient left: in chair;with call bell/phone within reach Nurse Communication: Mobility status         Time: 1545-1630 PT Time Calculation (min) (ACUTE ONLY): 45 min   Charges:   PT Evaluation $PT Eval Moderate Complexity: 1 Procedure PT Treatments $Therapeutic Exercise: 8-22 mins $Therapeutic Activity: 8-22 mins   PT G Codes:        Colletta Maryland 09/22/2016, 5:11 PM  Roney Marion, PT  Acute Rehabilitation Services Pager [phone] Office [phone]

## 2016-09-22 NOTE — Addendum Note (Signed)
Addendum  created 09/22/16 1207 by Sammie Bench, CRNA   Anesthesia Intra Flowsheets edited

## 2016-09-22 NOTE — Progress Notes (Signed)
Orthopedic Tech Progress Note Patient Details:  [name] [birth year] [id number]  CPM Right Knee CPM Right Knee: On Right Knee Flexion (Degrees): 90 Right Knee Extension (Degrees): 0 Additional Comments: trapeze bar patient helper   Hildred Priest 09/22/2016, 10:33 AM Viewed order from doctor's order list

## 2016-09-22 NOTE — Anesthesia Preprocedure Evaluation (Addendum)
Anesthesia Evaluation  Patient identified by MRN, date of birth, ID band Patient awake    Reviewed: Allergy & Precautions, Patient's Chart, lab work & pertinent test results  History of Anesthesia Complications (+) PONV  Airway Mallampati: II  TM Distance: >3 FB Neck ROM: Full    Dental  (+) Caps, Teeth Intact, Dental Advisory Given   Pulmonary former smoker,    breath sounds clear to auscultation       Cardiovascular hypertension,  Rhythm:Regular Rate:Normal     Neuro/Psych    GI/Hepatic negative GI ROS, Neg liver ROS,   Endo/Other  Morbid obesity  Renal/GU negative Renal ROS     Musculoskeletal  (+) Arthritis ,   Abdominal   Peds  Hematology negative hematology ROS (+)   Anesthesia Other Findings   Reproductive/Obstetrics                            Anesthesia Physical Anesthesia Plan  ASA: II  Anesthesia Plan: Spinal   Post-op Pain Management:  Regional for Post-op pain   Induction:   Airway Management Planned: Simple Face Mask  Additional Equipment:   Intra-op Plan:   Post-operative Plan:   Informed Consent: I have reviewed the patients History and Physical, chart, labs and discussed the procedure including the risks, benefits and alternatives for the proposed anesthesia with the patient or authorized representative who has indicated his/her understanding and acceptance.     Plan Discussed with: CRNA  Anesthesia Plan Comments:         Anesthesia Quick Evaluation

## 2016-09-23 LAB — CBC
HCT: 27.1 % — ABNORMAL LOW (ref 36.0–46.0)
Hemoglobin: 9.7 g/dL — ABNORMAL LOW (ref 12.0–15.0)
MCH: 33 pg (ref 26.0–34.0)
MCHC: 35.8 g/dL (ref 30.0–36.0)
MCV: 92.2 fL (ref 78.0–100.0)
Platelets: 216 10*3/uL (ref 150–400)
RBC: 2.94 MIL/uL — ABNORMAL LOW (ref 3.87–5.11)
RDW: 12.9 % (ref 11.5–15.5)
WBC: 10.8 10*3/uL — ABNORMAL HIGH (ref 4.0–10.5)

## 2016-09-23 LAB — BASIC METABOLIC PANEL
Anion gap: 8 (ref 5–15)
BUN: 18 mg/dL (ref 6–20)
CALCIUM: 8.9 mg/dL (ref 8.9–10.3)
CO2: 25 mmol/L (ref 22–32)
CREATININE: 1.04 mg/dL — AB (ref 0.44–1.00)
Chloride: 102 mmol/L (ref 101–111)
GFR calc non Af Amer: 55 mL/min — ABNORMAL LOW (ref >60)
Glucose, Bld: 98 mg/dL (ref 65–99)
Potassium: 3.7 mmol/L (ref 3.5–5.1)
SODIUM: 135 mmol/L (ref 135–145)

## 2016-09-23 NOTE — Care Management Note (Signed)
Case Management Note  Patient Details  Name: [name] MRN: [id number] Date of Birth: November 16, 1950  Subjective/Objective:   66 yr old female s/p right total knee arthroplasty.                  Action/Plan: Case manager spoke with patient concerning discharge plan and DME needs. Patient was preoperatively setup with [facility], no changes. She states she has rolling walker and 3in1 from previous surgery. Patient states she was told that CPM would be delivered to her home on Thursday 09/24/16. She will have family support at discharge.    Expected Discharge Date:   1/`8/18               Expected Discharge Plan:  [place]  In-House Referral:  NA  Discharge planning Services  CM Consult  Post Acute Care Choice:  Home Health, Durable Medical Equipment Choice offered to:  Patient  DME Arranged:  CPM DME Agency:  [facility]  HH Arranged:  PT [place] Agency:  [facility] (formerly [employer])  Status of Service:  Completed, signed off  If discussed at [employer] [facility] [employer], dates discussed:    Additional Comments:  Ninfa Meeker, RN 09/23/2016, 2:17 PM

## 2016-09-23 NOTE — Evaluation (Signed)
Occupational Therapy Evaluation Patient Details Name: [name] MRN: [id number] DOB: [birth year] Today's Date: 09/23/2016    History of Present Illness Admitted for RTKA, 50% PWB;  has a past medical history of Arthritis; Cancer ([place]); Complication of anesthesia; Hypertension; and PONV (postoperative nausea and vomiting).  has a pertinent past surgical history that includes  Hand surgery (Right, 06); Knee arthroscopy (Right, 10); and Total knee arthroplasty (Left, 09/19/2013).   Clinical Impression   Pt is at min A level with LB ADLs and min guard A with ADL mobility. Pt will have 24/7 assist from spouse and has all necessary DME and A/E at home form previous knee/ortho surgeries. All education completed and no further acute OT indicated    Follow Up Recommendations  No OT follow up;Supervision - Intermittent    Equipment Recommendations  None recommended by OT    Recommendations for Other Services       Precautions / Restrictions Precautions Precautions: Knee Precaution Booklet Issued: Yes (comment) Precaution Comments: reviewed no pillow under knee Restrictions Weight Bearing Restrictions: Yes RLE Weight Bearing: Partial weight bearing RLE Partial Weight Bearing Percentage or Pounds: 50      Mobility Bed Mobility               General bed mobility comments: pt up in recliner  Transfers Overall transfer level: Needs assistance Equipment used: Rolling walker (2 wheeled) Transfers: Sit to/from Stand Sit to Stand: Min guard         General transfer comment: Cues for hand placement and safety; cues also to allow R knee to bend during the transition    Balance Overall balance assessment: Needs assistance   Sitting balance-Leahy Scale: Good     Standing balance support: Bilateral upper extremity supported;No upper extremity supported;During functional activity Standing balance-Leahy Scale: Fair Standing balance comment: Able to keep 50%PWB while  standing, even without UE support                            ADL Overall ADL's : Needs assistance/impaired     Grooming: Wash/dry hands;Wash/dry face;Sitting;Supervision/safety;Set up   Upper Body Bathing: Set up;Sitting   Lower Body Bathing: Minimal assistance   Upper Body Dressing : Set up;Sitting   Lower Body Dressing: Minimal assistance   Toilet Transfer: Min guard;RW;Ambulation;Comfort height toilet   Toileting- Clothing Manipulation and Hygiene: Min guard;Sit to/from stand   Tub/ Shower Transfer: Min guard;3 in [place] walker;Ambulation   Functional mobility during ADLs: Min guard General ADL Comments: pt has DME and A/E at home from previous ortho surgeries     Vision Vision Assessment?: No apparent visual deficits              Pertinent Vitals/Pain Pain Assessment: 0-10 Pain Score: 4  Pain Location: R knee Pain Descriptors / Indicators: Aching;Sore Pain Intervention(s): Monitored during session;Premedicated before session;Repositioned     Hand Dominance Right   Extremity/Trunk Assessment Upper Extremity Assessment Upper Extremity Assessment: Overall WFL for tasks assessed   Lower Extremity Assessment Lower Extremity Assessment: Defer to PT evaluation   Cervical / Trunk Assessment Cervical / Trunk Assessment: Normal   Communication Communication Communication: No difficulties   Cognition Arousal/Alertness: Awake/alert Behavior During Therapy: WFL for tasks assessed/performed Overall Cognitive Status: Within Functional Limits for tasks assessed                     General Comments   pt very pleasant and cooperative  Home Living Family/patient expects to be discharged to:: Private residence Living Arrangements: Spouse/significant other Available Help at Discharge: Family;Available 24 hours/day Type of Home: House Home Access: Stairs to enter [employer] of Steps: 2 Entrance Stairs-Rails:  None Home Layout: One level     Bathroom Shower/Tub: Occupational psychologist: Handicapped height     Home Equipment: Grab bars - tub/shower;Walker - 2 wheels;Shower seat - built in;Bedside commode          Prior Functioning/Environment Level of Independence: Independent                 OT Problem List: Pain;Decreased activity tolerance   OT Treatment/Interventions:      OT Goals(Current goals can be found in the care plan section) Acute Rehab OT Goals Patient Stated Goal: travel again OT Goal Formulation: With patient/family Time For Goal Achievement: 09/30/16 Potential to Achieve Goals: Good  OT Frequency:     Barriers to D/C:   No barriers                       End of Session Equipment Utilized During Treatment: Rolling walker;Other (comment) (3 in 1) CPM Right Knee CPM Right Knee: Off  Activity Tolerance: Patient tolerated treatment well Patient left: in chair;with call bell/phone within reach;with family/visitor present   Time: [id number] OT Time Calculation (min): 28 min Charges:  OT General Charges $OT Visit: 1 Procedure OT Evaluation $OT Eval Moderate Complexity: 1 Procedure OT Treatments $Self Care/Home Management : 8-22 mins $Therapeutic Activity: 8-22 mins G-Codes:    Britt Bottom 09/23/2016, 12:26 PM

## 2016-09-23 NOTE — H&P (Signed)
PATIENT ID: [name]        MRN:  [id number]          DOB/AGE: 66-Oct-1952 / 66 y.o.    [name], MD   [name], PA-C [address] [place], [place]  [address]                             [phone]   PROGRESS NOTE  Subjective:  negative for Chest Pain  negative for Shortness of Breath  negative for Nausea/Vomiting   negative for Calf Pain    Tolerating Diet: yes         Patient reports pain as mild.     Comfortable sitting up in chair  Objective: Vital signs in last 24 hours:   Patient Vitals for the past 24 hrs:  BP Temp Temp src Pulse Resp SpO2  09/23/16 0629 (!) 113/45 97.8 F (36.6 C) Oral 71 16 99 %  09/23/16 0304 (!) 125/45 98.9 F (37.2 C) Oral 62 16 97 %  09/22/16 1942 (!) 118/47 99 F (37.2 C) Oral 65 16 97 %  09/22/16 1500 (!) 132/52 98.4 F (36.9 C) Oral 62 16 97 %  09/22/16 1130 - 97.7 F (36.5 C) - 73 16 98 %  09/22/16 1115 - - - (!) 57 10 98 %  09/22/16 1045 - - - (!) 56 11 100 %  09/22/16 1030 - - - 65 16 97 %  09/22/16 1015 - - - (!) 58 12 100 %  09/22/16 1000 (!) 141/46 - - 67 16 98 %  09/22/16 0945 - - - 70 (!) 24 94 %  09/22/16 0937 138/79 97.9 F (36.6 C) - 78 18 92 %      Intake/Output from previous day:   01/16 0701 - 01/17 0700 In: 3875 [P.O.:340; I.V.:2235] Out: [id number] [Urine:1375; Drains:200]   Intake/Output this shift:   No intake/output data recorded.   Intake/Output      01/16 0701 - 01/17 0700 01/17 0701 - 01/18 0700   P.O. 340    I.V. 2235    Other 1100    IV Piggyback 200    Total Intake 3875     Urine 1375    Drains 200    Blood 100    Total Output 1675     Net +2200             LABORATORY DATA:  Recent Labs  09/23/16 0314  WBC 10.8*  HGB 9.7*  HCT 27.1*  PLT 216    Recent Labs  09/23/16 0314  NA 135  K 3.7  CL 102  CO2 25  BUN 18  CREATININE 1.04*  GLUCOSE 98  CALCIUM 8.9   Lab Results  Component Value Date   INR 0.99 09/09/2016   INR 1.03 09/13/2013    Recent  Radiographic Studies :  Dg Chest 2 View  Result Date: 09/09/2016 CLINICAL DATA:  Preop total right knee replacement.  Hypertension. EXAM: CHEST  2 VIEW COMPARISON:  09/13/2013 FINDINGS: Linear densities in the lingula, likely scarring, stable. No acute airspace opacities. Heart is normal size. No effusions or acute bony abnormality. IMPRESSION: No active cardiopulmonary disease. Electronically Signed   By: Rolm Baptise M.D.   On: 09/09/2016 10:04     Examination:  General appearance: alert, cooperative and no distress  Wound Exam: clean, dry, intact   Drainage:  Moderate amount Serosanguinous exudate-100cc's in hemovac at [facility]  Motor Exam: weak EHL otherwise intact  Sensory Exam:decreased sensibility dorsum and plantar aspect of right foot otherwise intact   Vascular Exam: right foot warmer than left, good cap refill  Assessment:    1 Day Post-Op  Procedure(s) (LRB): RIGHT TOTAL KNEE ARTHROPLASTY (Right)  ADDITIONAL DIAGNOSIS:  Active Problems:   Osteoarthritis of left knee   S/P total knee replacement using cement, right  Acute Blood Loss Anemia-asymptomatic-monitor   Plan: Physical Therapy as ordered Partial Weight Bearing @ 50% (PWB)  DVT Prophylaxis:  Xarelto, Foot Pumps and TED hose  DISCHARGE PLAN: Home  DISCHARGE NEEDS: HHPT, CPM, Walker and 3-in-1 comode seat   OOB with PT, foley out and voiding, decreased sensibility in right foot may be related to block-will monitor,saline lock IV     Garald Balding  09/23/2016 9:10 AM

## 2016-09-23 NOTE — Progress Notes (Signed)
Physical Therapy Treatment Patient Details Name: [name] MRN: [id number] DOB: 1951/08/03 Today's Date: 09/23/2016    History of Present Illness Admitted for RTKA, 50% PWB;  has a past medical history of Arthritis; Cancer ([place]); Complication of anesthesia; Hypertension; and PONV (postoperative nausea and vomiting).  has a pertinent past surgical history that includes  Hand surgery (Right, 06); Knee arthroscopy (Right, 10); and Total knee arthroplasty (Left, 09/19/2013).    PT Comments    Session focused on progressive amb and activity tolerance; Progressing very well; Noting also gait pattern smoothed out with better knee flexion in swing with incr repetition and distance; plan for therex and stair practice next session; on track for dc home tomorrow  Follow Up Recommendations  Home health PT;Supervision/Assistance - 24 hour     Equipment Recommendations  Rolling walker with 5" wheels;3in1 (PT) ([employer]-sized)    Recommendations for Other Services       Precautions / Restrictions Precautions Precautions: Knee Precaution Booklet Issued: Yes (comment) Precaution Comments: Pt educated to not allow any pillow or bolster under knee for healing with optimal range of motion.  Restrictions Weight Bearing Restrictions: Yes RLE Weight Bearing: Partial weight bearing RLE Partial Weight Bearing Percentage or Pounds: 50    Mobility  Bed Mobility                  Transfers Overall transfer level: Needs assistance Equipment used: Rolling walker (2 wheeled) Transfers: Sit to/from Stand Sit to Stand: Min guard (without physical assist)         General transfer comment: Cues for hand placement and safety; cues also to allow R knee to bend during the transition  Ambulation/Gait Ambulation/Gait assistance: Min guard (without physical contact) Ambulation Distance (Feet): 200 Feet Assistive device: Rolling walker (2 wheeled) Gait Pattern/deviations: Step-through pattern      General Gait Details: Cues for gait sequence and to self-monitor for activity tolerance; Cues aslo to smooth out gait pattern, and allow for greater R knee flexion in swing; Good 50%PWB during R stance   Stairs            Wheelchair Mobility    Modified Rankin (Stroke Patients Only)       Balance Overall balance assessment: Needs assistance         Standing balance support: Bilateral upper extremity supported;No upper extremity supported Standing balance-Leahy Scale: Fair Standing balance comment: Able to keep 50%PWB while standing, even without UE support                    Cognition Arousal/Alertness: Awake/alert Behavior During Therapy: WFL for tasks assessed/performed Overall Cognitive Status: Within Functional Limits for tasks assessed                      Exercises Total Joint Exercises Ankle Circles/Pumps:  (Plan for focused therex in afternoon session)    General Comments        Pertinent Vitals/Pain Pain Assessment: 0-10 Pain Score: 5  Pain Location: R knee when pushing knee flexion ROM; subsides to 3/10 pretty quickly Pain Descriptors / Indicators: Aching Pain Intervention(s): Monitored during session    Home Living                      Prior Function            PT Goals (current goals can now be found in the care plan section) Acute Rehab PT Goals Patient Stated Goal: walk nature trails  PT Goal Formulation: With patient Time For Goal Achievement: 09/29/16 Potential to Achieve Goals: Good Progress towards PT goals: Progressing toward goals    Frequency    7X/week      PT Plan Current plan remains appropriate    Co-evaluation             End of Session Equipment Utilized During Treatment: Gait belt Activity Tolerance: Patient tolerated treatment well Patient left: in chair;with call bell/phone within reach;with family/visitor present     Time: 0916-0940 PT Time Calculation (min) (ACUTE ONLY): 24  min  Charges:  $Gait Training: 23-37 mins                    G Codes:      Colletta [place] September 25, 2016, 10:50 AM  Roney Marion, [place] Pager [phone] Office [phone]

## 2016-09-23 NOTE — Progress Notes (Signed)
Orthopedic Tech Progress Note Patient Details:  [name] 05-15-1951 [id number]  Patient ID: [name], female   DOB: [birth year], 66 y.o.   MRN: [id number]   Hildred Priest 09/23/2016, 1:30 PM Placed pt's rle on cpm @0 -60 degrees @1330 ; RN notified

## 2016-09-23 NOTE — Progress Notes (Signed)
Physical Therapy Treatment Patient Details Name: Carloyn Schulke MRN: [id number] DOB: Oct 16, 1950 Today's Date: 09/23/2016    History of Present Illness Admitted for RTKA, 50% PWB;  has a past medical history of Arthritis; Cancer ([place]); Complication of anesthesia; Hypertension; and PONV (postoperative nausea and vomiting).  has a pertinent past surgical history that includes  Hand surgery (Right, 06); Knee arthroscopy (Right, 10); and Total knee arthroplasty (Left, 09/19/2013).    PT Comments    Good progress with therex, knee control; Stair training initiated; on track fo rdc home tomorrow; Will plan on reviewing stairs with husband assist  Follow Up Recommendations  Home health PT;Supervision/Assistance - 24 hour     Equipment Recommendations  Rolling walker with 5" wheels;3in1 (PT) ([employer]-sized)    Recommendations for Other Services       Precautions / Restrictions Precautions Precautions: Knee Precaution Booklet Issued: Yes (comment) Precaution Comments: Pt educated to not allow any pillow or bolster under knee for healing with optimal range of motion.  Restrictions Weight Bearing Restrictions: Yes RLE Weight Bearing: Partial weight bearing RLE Partial Weight Bearing Percentage or Pounds: 50    Mobility  Bed Mobility Overal bed mobility: Needs Assistance Bed Mobility: Supine to Sit     Supine to sit: Supervision     General bed mobility comments: pt up in recliner  Transfers Overall transfer level: Needs assistance Equipment used: Rolling walker (2 wheeled) Transfers: Sit to/from Stand Sit to Stand: Min guard (without physical assist)         General transfer comment: Cues for hand placement and safety; cues also to allow R knee to bend during the transition  Ambulation/Gait Ambulation/Gait assistance: Min guard (without physical contact)   Assistive device: Rolling walker (2 wheeled) Gait Pattern/deviations: Step-through pattern     General Gait  Details: Cues for gait sequence and to self-monitor for activity tolerance; Cues aslo to smooth out gait pattern, and allow for greater R knee flexion in swing; Good 50%PWB during R stance   Stairs Stairs: Yes   Stair Management: No rails;Backwards;With walker Number of Stairs: 2 General stair comments: Cues for technique; Plan to practice with husband next session  Wheelchair Mobility    Modified Rankin (Stroke Patients Only)       Balance Overall balance assessment: Needs assistance   Sitting balance-Leahy Scale: Good     Standing balance support: Bilateral upper extremity supported;No upper extremity supported Standing balance-Leahy Scale: Fair Standing balance comment: Able to keep 50%PWB while standing, even without UE support                    Cognition Arousal/Alertness: Awake/alert Behavior During Therapy: WFL for tasks assessed/performed Overall Cognitive Status: Within Functional Limits for tasks assessed                      Exercises Total Joint Exercises Ankle Circles/Pumps: AROM;Both;10 reps (Plan for focused therex in afternoon session) Quad Sets: AROM;Right;10 reps Short Arc Quad: AROM;Right;10 reps Heel Slides: AROM;Right;10 reps Straight Leg Raises: AROM;Right;10 reps Goniometric ROM: approx 3-70    General Comments        Pertinent Vitals/Pain Pain Assessment: 0-10 Pain Score: 7  Pain Location: R knee when pushing knee flexion ROM; subsides to 3/10 pretty quickly Pain Descriptors / Indicators: Aching Pain Intervention(s): Monitored during session;Premedicated before session;Repositioned    Home Living Family/patient expects to be discharged to:: Private residence Living Arrangements: Spouse/significant other Available Help at Discharge: Family;Available 24 hours/day Type of Home:  House Home Access: Stairs to enter Entrance Stairs-Rails: None Home Layout: One level Home Equipment: Grab bars - tub/shower;Walker - 2  wheels;Shower seat - built in;Bedside commode      Prior Function Level of Independence: Independent          PT Goals (current goals can now be found in the care plan section) Acute Rehab PT Goals Patient Stated Goal: walk nature trails PT Goal Formulation: With patient Time For Goal Achievement: 09/29/16 Potential to Achieve Goals: Good Progress towards PT goals: Progressing toward goals    Frequency    7X/week      PT Plan Current plan remains appropriate    Co-evaluation             End of Session Equipment Utilized During Treatment: Gait belt Activity Tolerance: Patient tolerated treatment well Patient left: in chair;with call bell/phone within reach;with family/visitor present     Time: [id number] PT Time Calculation (min) (ACUTE ONLY): 31 min  Charges:  $Gait Training: 8-22 mins $Therapeutic Exercise: 8-22 mins                    G Codes:      Colletta Maryland 10-05-2016, 4:15 PM  Roney Marion, [place] Pager [phone] Office [phone]

## 2016-09-24 ENCOUNTER — Encounter (HOSPITAL_COMMUNITY): Payer: Self-pay | Admitting: Orthopaedic Surgery

## 2016-09-24 LAB — BASIC METABOLIC PANEL
Anion gap: 8 (ref 5–15)
BUN: 12 mg/dL (ref 6–20)
CHLORIDE: 102 mmol/L (ref 101–111)
CO2: 27 mmol/L (ref 22–32)
CREATININE: 0.82 mg/dL (ref 0.44–1.00)
Calcium: 9.2 mg/dL (ref 8.9–10.3)
GFR calc Af Amer: >60 mL/min (ref >60)
GFR calc non Af Amer: >60 mL/min (ref >60)
Glucose, Bld: 104 mg/dL — ABNORMAL HIGH (ref 65–99)
Potassium: 4.4 mmol/L (ref 3.5–5.1)
Sodium: 137 mmol/L (ref 135–145)

## 2016-09-24 LAB — CBC
HEMATOCRIT: 27.6 % — AB (ref 36.0–46.0)
HEMOGLOBIN: 9.6 g/dL — AB (ref 12.0–15.0)
MCH: 32.8 pg (ref 26.0–34.0)
MCHC: 34.8 g/dL (ref 30.0–36.0)
MCV: 94.2 fL (ref 78.0–100.0)
Platelets: 192 10*3/uL (ref 150–400)
RBC: 2.93 MIL/uL — ABNORMAL LOW (ref 3.87–5.11)
RDW: 13 % (ref 11.5–15.5)
WBC: 9 10*3/uL (ref 4.0–10.5)

## 2016-09-24 MED ORDER — POLYETHYLENE GLYCOL 3350 17 G PO PACK: 17.0000 g | PACK | Freq: Every day | ORAL | 0 refills | Status: AC | PRN

## 2016-09-24 MED ORDER — RIVAROXABAN 10 MG PO TABS: 10.0000 mg | ORAL_TABLET | Freq: Every day | ORAL | 0 refills | Status: DC

## 2016-09-24 MED ORDER — METHOCARBAMOL 500 MG PO TABS
500.0000 mg | ORAL_TABLET | Freq: Four times a day (QID) | ORAL | 0 refills | Status: DC | PRN
Start: 2016-09-24 — End: 2016-10-07

## 2016-09-24 MED ORDER — OXYCODONE HCL 5 MG PO TABS: 5.0000 mg | ORAL_TABLET | ORAL | 0 refills | Status: DC | PRN

## 2016-09-24 NOTE — Op Note (Signed)
PATIENT ID: [name]        MRN:  [id number]          DOB/AGE: November 27, 1950 / 66 y.o.    [name], MD   [name], PA-C [address] [place], [place]  [address]                             [phone]   PROGRESS NOTE  Subjective:  negative for Chest Pain  negative for Shortness of Breath  negative for Nausea/Vomiting   negative for Calf Pain    Tolerating Diet: yes         Patient reports pain as mild.     Comfortable night  Objective: Vital signs in last 24 hours:   Patient Vitals for the past 24 hrs:  BP Temp Temp src Pulse Resp SpO2  09/24/16 0700 (!) 121/46 98.6 F (37 C) Oral 70 18 96 %  09/23/16 2345 (!) 132/48 98.6 F (37 C) Oral 72 18 93 %  09/23/16 1340 (!) 115/37 97.1 F (36.2 C) Oral (!) 55 18 98 %      Intake/Output from previous day:   01/17 0701 - 01/18 0700 In: 600 [P.O.:600] Out: 250 [Drains:250]   Intake/Output this shift:   No intake/output data recorded.   Intake/Output      01/17 0701 - 01/18 0700 01/18 0701 - 01/19 0700   P.O. 600    I.V.     Other     IV Piggyback     Total Intake 600     Urine 0    Drains 250    Blood     Total Output 250     Net +350          Urine Occurrence 3 x       LABORATORY DATA:  Recent Labs  09/23/16 0314 09/24/16 0535  WBC 10.8* 9.0  HGB 9.7* 9.6*  HCT 27.1* 27.6*  PLT 216 192    Recent Labs  09/23/16 0314 09/24/16 0535  NA 135 137  K 3.7 4.4  CL 102 102  CO2 25 27  BUN 18 12  CREATININE 1.04* 0.82  GLUCOSE 98 104*  CALCIUM 8.9 9.2   Lab Results  Component Value Date   INR 0.99 09/09/2016   INR 1.03 09/13/2013    Recent Radiographic Studies :  Dg Chest 2 View  Result Date: 09/09/2016 CLINICAL DATA:  Preop total right knee replacement.  Hypertension. EXAM: CHEST  2 VIEW COMPARISON:  09/13/2013 FINDINGS: Linear densities in the lingula, likely scarring, stable. No acute airspace opacities. Heart is normal size. No effusions or acute bony abnormality.  IMPRESSION: No active cardiopulmonary disease. Electronically Signed   By: Rolm Baptise M.D.   On: 09/09/2016 10:04     Examination:  General appearance: alert, cooperative and no distress  Wound Exam: clean, dry, intact   Drainage:  Scant/small amount Serosanguinous exudate in hemovac  Motor Exam: EHL functioning-other motors intact  Sensory Exam: still with limited sensibility plantar and dorsum of foot-leg sensation OK  Vascular Exam: right foot warm, good cap refill  Assessment:    2 Days Post-Op  Procedure(s) (LRB): RIGHT TOTAL KNEE ARTHROPLASTY (Right)  ADDITIONAL DIAGNOSIS:  Active Problems:   Osteoarthritis of left knee   S/P total knee replacement using cement, right  Acute Blood Loss Anemia-stabel and asymptomatic   Plan: Physical Therapy as ordered Partial Weight Bearing @ 50% (PWB)  DVT Prophylaxis:  Xarelto, Foot Pumps and TED hose  DISCHARGE PLAN: Home  DISCHARGE NEEDS: HHPT, CPM, Walker and 3-in-1 comode seat  Doing well and would like to go home-consider D/C this pm when weather improves and snow is manageable, voiding without problem, good effort in PT, not sure why sensation in foot is altered when all motors functioning-almost like stocking/glove issue-monitor      Garald Balding  09/24/2016 9:36 AM

## 2016-09-24 NOTE — Progress Notes (Signed)
Orthopedic Tech Progress Note Patient Details:  Barabra Timp 04-05-1951 [id number]  Patient ID: [name], female   DOB: [birth year], 66 y.o.   MRN: [id number] Took off cpm  Karolee Stamps 09/24/2016, 6:38 AM

## 2016-09-24 NOTE — Progress Notes (Signed)
Physical Therapy Treatment Patient Details Name: [name] MRN: [id number] DOB: Jan 18, 1951 Today's Date: 09/24/2016    History of Present Illness Admitted for RTKA, 50% PWB;  has a past medical history of Arthritis; Cancer ([place]); Complication of anesthesia; Hypertension; and PONV (postoperative nausea and vomiting).  has a pertinent past surgical history that includes  Hand surgery (Right, 06); Knee arthroscopy (Right, 10); and Total knee arthroplasty (Left, 09/19/2013).    PT Comments    Patient is making great progress. She maintained her gait distance. She continued to have some numbness in her foot. She verbalized a good understanding of things to look for when she goes home as far as rugs and objects in her house. She is comfortable with stairs. She would benefit from further skilled therapy.   Follow Up Recommendations  Home health PT;Supervision/Assistance - 24 hour     Equipment Recommendations  Rolling walker with 5" wheels;3in1 (PT) (Youth-sized)    Recommendations for Other Services       Precautions / Restrictions Precautions Precautions: Knee Precaution Booklet Issued: Yes (comment) Restrictions Weight Bearing Restrictions: Yes RLE Weight Bearing: Partial weight bearing RLE Partial Weight Bearing Percentage or Pounds: 50    Mobility  Bed Mobility Overal bed mobility: Needs Assistance Bed Mobility: Supine to Sit     Supine to sit: Supervision     General bed mobility comments: able to get out of the bed with minimal difficulty   Transfers Overall transfer level: Needs assistance Equipment used: Rolling walker (2 wheeled) Transfers: Sit to/from Stand Sit to Stand: Min guard (without physical assist)         General transfer comment: decreased saefty cues required today   Ambulation/Gait Ambulation/Gait assistance: Min guard (without physical contact)   Assistive device: Rolling walker (2 wheeled) Gait Pattern/deviations: Step-through pattern  300' w/ RW    General Gait Details: Cues for gait sequence and to self-monitor for activity tolerance; Cues aslo to smooth out gait pattern, and allow for greater R knee flexion in swing; Good 50%PWB during R stance   Stairs            Wheelchair Mobility    Modified Rankin (Stroke Patients Only)       Balance                                    Cognition Arousal/Alertness: Awake/alert Behavior During Therapy: WFL for tasks assessed/performed Overall Cognitive Status: Within Functional Limits for tasks assessed                      Exercises Total Joint Exercises Ankle Circles/Pumps: AROM;Both;10 reps (Plan for focused therex in afternoon session) Quad Sets: AROM;Right;10 reps Heel Slides: AROM;Right;10 reps Goniometric ROM: 3-78 (Therapy weorked on PROM for 5 min )    General Comments        Pertinent Vitals/Pain Pain Assessment: 0-10 Pain Score: 7  Pain Location: R knee when pushing knee flexion ROM; subsides to 3/10 pretty quickly Pain Descriptors / Indicators: Aching    Home Living                      Prior Function            PT Goals (current goals can now be found in the care plan section) Acute Rehab PT Goals Patient Stated Goal: walk nature trails PT Goal Formulation: With patient Time For Goal  Achievement: 09/29/16 Potential to Achieve Goals: Good Progress towards PT goals: Progressing toward goals    Frequency    7X/week      PT Plan Current plan remains appropriate    Co-evaluation             End of Session Equipment Utilized During Treatment: Gait belt Activity Tolerance: Patient tolerated treatment well Patient left: in chair;with call bell/phone within reach;with family/visitor present     Time: [id number] PT Time Calculation (min) (ACUTE ONLY): 23 min  Charges:  $Gait Training: 8-22 mins $Therapeutic Exercise: 8-22 mins                    G Codes:      Carney Living October 22, 2016,  12:13 PM

## 2016-09-24 NOTE — Discharge Summary (Signed)
[name], MD   [name], PA-C [address], [place], [place]  [address]                             [phone]  PATIENT ID: [name]        MRN:  [id number]          DOB/AGE: 1951/03/04 / 66 y.o.    DISCHARGE SUMMARY  ADMISSION DATE:    09/22/2016 DISCHARGE DATE:   09/24/2016   ADMISSION DIAGNOSIS: RIGHT KNEE OSTEOARTHRITIS    DISCHARGE DIAGNOSIS:  RIGHT KNEE OSTEOARTHRITIS    ADDITIONAL DIAGNOSIS: Active Problems:   Osteoarthritis of left knee   S/P total knee replacement using cement, right  Past Medical History:  Diagnosis Date  . Arthritis   . Cancer ([place])    melanoma back  . Complication of anesthesia    ? bp- passed out after in room.   [place] Hypertension   . PONV (postoperative nausea and vomiting)     PROCEDURE: Procedure(s): RIGHT TOTAL KNEE ARTHROPLASTY  on 09/22/2016  CONSULTS: none    HISTORY: Patient is a 66 y.o.femalepresented with a history of pain in the rightknee for 7 years. Initially in June 2010 she had developed a significant pain in her right knee. Dr. Quillian Quince a [facility] and [facility] had injected her that only gave minimal relief. MRI scan was performed and the results revealed tricompartmental degenerative changes with full thickness cartilage loss along the lateral facet. She has diffuse cartilage irregularity along the medial facet. She has some areas of full thickness cartilage loss in the medial compartmentanda possible medial meniscal tear. She underwent an arthroscopic partial medial lateral meniscectomy of the right knee with shaving of the medial compartment total femoral joint of the right knee on 03/07/2009.  She will return back in September 2015, and a repeat MRI scan revealed considerable degenerative changes in all 3 compartments progress from her 2010 scan. It is particularly more prominent in the medial compartment of the patellofemoral joint but also had complete cartilage loss laterally. She  had difficulty with anti-inflammatories because of her blood pressure. Corticosteroid injection was startedand Euflexxa series of 3 was given to the right knee following the cortisone injection.  In February 2016 , she had a corticosteroid injection. In May and followed a 5 series of Hyalgan injections. In July 2017 she returned and had a steroid injection. Her symptoms were progressing well at that time she did not want to consider total knee replacement. Since that time however her symptoms have worsened. Seen today for evaluation.  nset of symptoms was abrupt starting 7yearsagowith gradually worseningcourse since that time. Prior procedures on the knee are arthroscopy. Patient has been treated conservatively with over-the-counter NSAIDs and activity modification. Patient currently rates pain in the knee at 5-8out of 10 with activity. There is pain at night.present.  They have been previously treated with: NSAIDS: Diclofenac gel with mild improvement Knee injection with corticosteroid wasperformed Knee injection with visco supplementation wasperformed  HOSPITAL COURSE:  [name] is a 66 y.o. admitted on 09/22/2016 and found to have a diagnosis of [place].  After appropriate laboratory studies were obtained  they were taken to the operating room on 09/22/2016 and underwent  Procedure(s): RIGHT TOTAL KNEE ARTHROPLASTY  .   They were given perioperative antibiotics:  Anti-infectives    Start     Dose/Rate Route Frequency Ordered Stop   09/22/16 1300  ceFAZolin (ANCEF) IVPB 2g/100 mL premix     2 g 200 mL/hr over 30 Minutes Intravenous Every 6 hours 09/22/16 1215 09/22/16 1926   09/22/16 0517  ceFAZolin (ANCEF) IVPB 2g/100 mL premix     2 g 200 mL/hr over 30 Minutes Intravenous On call to O.R. 09/22/16 [id number] 09/22/16 0719    .  Tolerated the procedure well.  Placed with a foley intraoperatively.     Toradol was given post op.  POD #1, allowed out of bed to  a chair.  PT for ambulation and exercise program.  Foley D/C'd in morning.  IV saline locked.  O2 discontionued. Post op had decreased sensation dorsum and plantar of foot and EHL was weak.  POD #2, continued PT and ambulation.   Hemovac pulled. Sensation no change but stronger EHL .  The remainder of the hospital course was dedicated to ambulation and strengthening.   The patient was discharged on 2 Days Post-Op in  Stable condition.  Blood products given:none  DIAGNOSTIC STUDIES: Recent vital signs:  Patient Vitals for the past 24 hrs:  BP Temp Temp src Pulse Resp SpO2  09/24/16 0700 (!) 121/46 98.6 F (37 C) Oral 70 18 96 %  09/23/16 2345 (!) 132/48 98.6 F (37 C) Oral 72 18 93 %  09/23/16 1340 (!) 115/37 97.1 F (36.2 C) Oral (!) 55 18 98 %       Recent laboratory studies:  Recent Labs  09/23/16 0314 09/24/16 0535  WBC 10.8* 9.0  HGB 9.7* 9.6*  HCT 27.1* 27.6*  PLT 216 192    Recent Labs  09/23/16 0314 09/24/16 0535  NA 135 137  K 3.7 4.4  CL 102 102  CO2 25 27  BUN 18 12  CREATININE 1.04* 0.82  GLUCOSE 98 104*  CALCIUM 8.9 9.2   Lab Results  Component Value Date   INR 0.99 09/09/2016   INR 1.03 09/13/2013     Recent Radiographic Studies :  Dg Chest 2 View  Result Date: 09/09/2016 CLINICAL DATA:  Preop total right knee replacement.  Hypertension. EXAM: CHEST  2 VIEW COMPARISON:  09/13/2013 FINDINGS: Linear densities in the lingula, likely scarring, stable. No acute airspace opacities. Heart is normal size. No effusions or acute bony abnormality. IMPRESSION: No active cardiopulmonary disease. Electronically Signed   By: Rolm Baptise M.D.   On: 09/09/2016 10:04    DISCHARGE INSTRUCTIONS: Discharge Instructions    CPM    Complete by:  As directed    Continuous passive motion machine (CPM):      Use the CPM from 0 to 60 for 6-8 hours per day.      You may increase by 5-10 per day.  You may break it up into 2 or 3 sessions per day.      Use CPM for 4  weeks or until you are told to stop.   Call MD / Call 911    Complete by:  As directed    If you experience chest pain or shortness of breath, CALL 911 and be transported to the hospital emergency room.  If you develope a fever above 101 F, pus (white drainage) or increased drainage or redness at the wound, or calf pain, call your surgeon's office.   Change dressing    Complete by:  As directed    Do not change dressing-will change in office.   Constipation Prevention    Complete by:  As directed    Drink plenty of fluids.  Prune juice may be helpful.  You may use a stool softener, such as Colace (over the counter) 100 mg twice a day.  Use MiraLax (over the counter) for constipation as needed.   Diet general    Complete by:  As directed    Discharge instructions    Complete by:  As directed    [place] items at home which could result in a fall. This includes throw rugs or furniture in walking pathways ICE to the affected joint every three hours while awake for 30 minutes at a time, for at least the first 3-5 days, and then as needed for pain and swelling.  Continue to use ice for pain and swelling. You may notice swelling that will progress down to the foot and ankle.  This is normal after surgery.  Elevate your leg when you are not up walking on it.   Continue to use the breathing machine you got in the hospital (incentive spirometer) which will help keep your temperature down.  It is common for your temperature to cycle up and down following surgery, especially at night when you are not up moving around and exerting yourself.  The breathing machine keeps your lungs expanded and your temperature down.   DIET:  As you were doing prior to hospitalization, we recommend a well-balanced diet.  DRESSING / WOUND CARE / SHOWERING  Keep the surgical dressing until follow up.  The dressing is water proof, so you can shower without any extra covering.  IF THE  DRESSING FALLS OFF or the wound gets wet inside, change the dressing with sterile gauze.  Please use good hand washing techniques before changing the dressing.  Do not use any lotions or creams on the incision until instructed by your surgeon.    ACTIVITY  Increase activity slowly as tolerated, but follow the weight bearing instructions below.   No driving for 6 weeks or until further direction given by your physician.  You cannot drive while taking narcotics.  No lifting or carrying greater than 10 lbs. until further directed by your surgeon. Avoid periods of inactivity such as sitting longer than an hour when not asleep. This helps prevent blood clots.  You may return to work once you are authorized by your doctor.     WEIGHT BEARING   Weight bearing as tolerated with assist device (walker, cane, etc) as directed, use it as long as suggested by your surgeon or therapist, typically at least 4-6 weeks.   EXERCISES  Results after joint replacement surgery are often greatly improved when you follow the exercise, range of motion and muscle strengthening exercises prescribed by your doctor. Safety measures are also important to protect the joint from further injury. Any time any of these exercises cause you to have increased pain or swelling, decrease what you are doing until you are comfortable again and then slowly increase them. If you have problems or questions, call your caregiver or physical therapist for advice.   Rehabilitation is important following a joint replacement. After just a few days of immobilization, the muscles of the leg can become weakened and shrink (atrophy).  These exercises are designed to build up the tone and strength of the thigh and leg muscles and to improve motion. Often times heat used for twenty to thirty minutes before working out will loosen up your tissues and help with improving the range of motion but do not use heat for the first two weeks following  surgery  (sometimes heat can increase post-operative swelling).   These exercises can be done on a training (exercise) mat, on the floor, on a table or on a bed. Use whatever works the best and is most comfortable for you.    Use music or television while you are exercising so that the exercises are a pleasant break in your day. This will make your life better with the exercises acting as a break in your routine that you can look forward to.   Perform all exercises about fifteen times, three times per day or as directed.  You should exercise both the operative leg and the other leg as well.   Exercises include:  Quad Sets - Tighten up the muscle on the front of the thigh (Quad) and hold for 5-10 seconds.   Straight Leg Raises - With your knee straight (if you were given a brace, keep it on), lift the leg to 60 degrees, hold for 3 seconds, and slowly lower the leg.  Perform this exercise against resistance later as your leg gets stronger.  Leg Slides: Lying on your back, slowly slide your foot toward your buttocks, bending your knee up off the floor (only go as far as is comfortable). Then slowly slide your foot back down until your leg is flat on the floor again.  Angel Wings: Lying on your back spread your legs to the side as far apart as you can without causing discomfort.  Hamstring Strength:  Lying on your back, push your heel against the floor with your leg straight by tightening up the muscles of your buttocks.  Repeat, but this time bend your knee to a comfortable angle, and push your heel against the floor.  You may put a pillow under the heel to make it more comfortable if necessary.   A rehabilitation program following joint replacement surgery can speed recovery and prevent re-injury in the future due to weakened muscles. Contact your doctor or a physical therapist for more information on knee rehabilitation.    CONSTIPATION  Constipation is defined medically as fewer than three stools per week  and severe constipation as less than one stool per week.  Even if you have a regular bowel pattern at home, your normal regimen is likely to be disrupted due to multiple reasons following surgery.  Combination of anesthesia, postoperative narcotics, change in appetite and fluid intake all can affect your bowels.   YOU MUST use at least one of the following options; they are listed in order of increasing strength to get the job done.  They are all available over the counter, and you may need to use some, POSSIBLY even all of these options:    Drink plenty of fluids (prune juice may be helpful) and high fiber foods Colace 100 mg by mouth twice a day  Senokot for constipation as directed and as needed Dulcolax (bisacodyl), take with full glass of water  Miralax (polyethylene glycol) once or twice a day as needed.  If you have tried all these things and are unable to have a bowel movement in the first 3-4 days after surgery call either your surgeon or your primary doctor.    If you experience loose stools or diarrhea, hold the medications until you stool forms back up.  If your symptoms do not get better within 1 week or if they get worse, check with your doctor.  If you experience "the worst abdominal pain ever" or develop nausea or vomiting, please contact the  office immediately for further recommendations for treatment.   ITCHING:  If you experience itching with your medications, try taking only a single pain pill, or even half a pain pill at a time.  You can also use Benadryl over the counter for itching or also to help with sleep.   TED HOSE STOCKINGS:  Use stockings on both legs until for at least 2 weeks or as directed by physician office. They may be removed at night for sleeping.  MEDICATIONS:  See your medication summary on the "After Visit Summary" that nursing will review with you.  You may have some home medications which will be placed on hold until you complete the course of blood  thinner medication.  It is important for you to complete the blood thinner medication as prescribed.  PRECAUTIONS:  If you experience chest pain or shortness of breath - call 911 immediately for transfer to the hospital emergency department.   If you develop a fever greater that 101 F, purulent drainage from wound, increased redness or drainage from wound, foul odor from the wound/dressing, or calf pain - CONTACT YOUR SURGEON.                                                   FOLLOW-UP APPOINTMENTS:  If you do not already have a post-op appointment, please call the office for an appointment to be seen by your surgeon.  Guidelines for how soon to be seen are listed in your "After Visit Summary", but are typically between 1-4 weeks after surgery.  OTHER INSTRUCTIONS:   Knee Replacement:  Do not place pillow under knee, focus on keeping the knee straight while resting. CPM instructions: 0-90 degrees, 2 hours in the morning, 2 hours in the afternoon, and 2 hours in the evening. Place foam block, curve side up under heel at all times except when in CPM or when walking.  DO NOT modify, tear, cut, or change the foam block in any way.  MAKE SURE YOU:  Understand these instructions.  Get help right away if you are not doing well or get worse.    Thank you for letting us be a part of your medical care team.  It is a privilege we respect greatly.  We hope these instructions will help you stay on track for a fast and full recovery!   Do not put a pillow under the knee. Place it under the heel.    Complete by:  As directed    Driving restrictions    Complete by:  As directed    No driving for6 weeks   Increase activity slowly as tolerated    Complete by:  As directed    Lifting restrictions    Complete by:  As directed    No lifting for 6 weeks   Partial weight bearing    Complete by:  As directed    50% weight bearing   Laterality:  right   Extremity:  Lower   Patient may shower    Complete by:   As directed    You may shower over the brown dressing. Do not remove the dressing-will be changed on first office visit   TED hose    Complete by:  As directed    Use stockings (TED hose) for 3 weeks on right leg(s).  You may remove  them at night for sleeping.      DISCHARGE MEDICATIONS:   Allergies as of 09/24/2016      Reactions   Norco [hydrocodone-acetaminophen] Nausea And Vomiting, Anxiety   Other Nausea And Vomiting, Anxiety   Darvocet      Medication List    STOP taking these medications   diclofenac sodium 1 % Gel Commonly known as:  VOLTAREN   traMADol 50 MG tablet Commonly known as:  ULTRAM     TAKE these medications   CALTRATE 600+D PO Take 1 tablet by mouth 2 (two) times daily.   Co Q10 100 MG Caps Take 100 mg by mouth daily.   diphenhydrAMINE 25 MG tablet Commonly known as:  BENADRYL Take 25 mg by mouth daily as needed for allergies.   lisinopril-hydrochlorothiazide 20-12.5 MG tablet Commonly known as:  PRINZIDE,ZESTORETIC Take 1 tablet by mouth daily at 12 noon.   methocarbamol 500 MG tablet Commonly known as:  ROBAXIN Take 1 tablet (500 mg total) by mouth every 6 (six) hours as needed for muscle spasms.   metoprolol tartrate 25 MG tablet Commonly known as:  LOPRESSOR Take 12.5 mg by mouth 2 (two) times daily.   montelukast 10 MG tablet Commonly known as:  SINGULAIR Take 10 mg by mouth at bedtime.   oxyCODONE 5 MG immediate release tablet Commonly known as:  Oxy IR/ROXICODONE Take 1-2 tablets (5-10 mg total) by mouth every 4 (four) hours as needed for breakthrough pain.   polyethylene glycol packet Commonly known as:  MIRALAX / GLYCOLAX Take 17 g by mouth daily as needed for mild constipation.   rivaroxaban 10 MG Tabs tablet Commonly known as:  XARELTO Take 1 tablet (10 mg total) by mouth daily with breakfast. Start taking on:  09/25/2016   tretinoin 0.05 % cream Commonly known as:  RETIN-A Apply 1 application topically every morning.  Apply to face            Durable Medical Equipment        Start     Ordered   09/22/16 1216  DME Walker rolling  Once    Question:  Patient needs a walker to treat with the following condition  Answer:  S/P TKR (total knee replacement) using cement, right   09/22/16 1215   09/22/16 1216  DME 3 n 1  Once     09/22/16 1215   09/22/16 1216  DME Bedside commode  Once    Question:  Patient needs a bedside commode to treat with the following condition  Answer:  S/P TKR (total knee replacement) using cement, right   09/22/16 1215      FOLLOW UP VISIT:   Follow-up Information    [facility] Follow up.   Specialty:  [place] Why:  Someone from [facility] will contact you to arrange start date and time for therapy. Contact information: [place] [address] [phone]        Garald Balding, MD Follow up on 10/07/2016.   Specialty:  Orthopedic Surgery Contact information: [address] [address] [phone]           DISPOSITION:   Home  CONDITION:  Stable   Mike Craze. [place], [place] [phone]  09/24/2016 10:29 AM

## 2016-09-25 DIAGNOSIS — M1711 Unilateral primary osteoarthritis, right knee: Secondary | ICD-10-CM | POA: Diagnosis not present

## 2016-09-28 ENCOUNTER — Telehealth (INDEPENDENT_AMBULATORY_CARE_PROVIDER_SITE_OTHER): Payer: Self-pay | Admitting: Orthopaedic Surgery

## 2016-09-28 NOTE — Telephone Encounter (Signed)
Patient states her right foot is still numb. I offered the patient an appointment tomorrow but she stated she will keep her appointment Wednesday. [facility] has not got in contact with patient since her total knee replacement surgery.

## 2016-09-28 NOTE — Telephone Encounter (Signed)
Please call [facility] to make sure they have patient inform. Thank you.

## 2016-09-28 NOTE — Telephone Encounter (Signed)
Called [facility] and a rep said they do not take her [employer] ins. but no one called PO to let us know.  Called [facility] to see if they take her insurance and could they see her. I faxed her insurance info, surgery notes and asked them to let PO know ASAP as her surgery was 09/22/16.  Spoke with pt and explained situation and I will keep her posted about HH.

## 2016-09-28 NOTE — Telephone Encounter (Signed)
Can you help please and I will call

## 2016-09-29 ENCOUNTER — Telehealth (INDEPENDENT_AMBULATORY_CARE_PROVIDER_SITE_OTHER): Payer: Self-pay | Admitting: Orthopaedic Surgery

## 2016-09-29 NOTE — Telephone Encounter (Signed)
tHANKS

## 2016-09-29 NOTE — Telephone Encounter (Signed)
Lauris w/[facility] [phone] called, they received all therapy information on this patient except the order for PT and weight baring status. Pls fax to [phone]

## 2016-09-29 NOTE — Telephone Encounter (Signed)
Patient called and states she has not heard from physical therapy and wanted to let Marcie Bal know.

## 2016-09-30 ENCOUNTER — Encounter (INDEPENDENT_AMBULATORY_CARE_PROVIDER_SITE_OTHER): Payer: Self-pay | Admitting: Orthopaedic Surgery

## 2016-09-30 ENCOUNTER — Other Ambulatory Visit (INDEPENDENT_AMBULATORY_CARE_PROVIDER_SITE_OTHER): Payer: Self-pay | Admitting: Orthopaedic Surgery

## 2016-09-30 ENCOUNTER — Ambulatory Visit (INDEPENDENT_AMBULATORY_CARE_PROVIDER_SITE_OTHER): Payer: Medicare HMO | Admitting: Orthopaedic Surgery

## 2016-09-30 ENCOUNTER — Encounter (INDEPENDENT_AMBULATORY_CARE_PROVIDER_SITE_OTHER): Payer: Self-pay | Admitting: Radiology

## 2016-09-30 ENCOUNTER — Telehealth (INDEPENDENT_AMBULATORY_CARE_PROVIDER_SITE_OTHER): Payer: Self-pay | Admitting: Radiology

## 2016-09-30 ENCOUNTER — Inpatient Hospital Stay (INDEPENDENT_AMBULATORY_CARE_PROVIDER_SITE_OTHER): Payer: Self-pay | Admitting: Orthopaedic Surgery

## 2016-09-30 VITALS — BP 106/58 | HR 72 | Ht 60.0 in | Wt 148.0 lb

## 2016-09-30 DIAGNOSIS — Z96651 Presence of right artificial knee joint: Secondary | ICD-10-CM | POA: Diagnosis not present

## 2016-09-30 DIAGNOSIS — Z79891 Long term (current) use of opiate analgesic: Secondary | ICD-10-CM | POA: Diagnosis not present

## 2016-09-30 DIAGNOSIS — Z471 Aftercare following joint replacement surgery: Secondary | ICD-10-CM | POA: Diagnosis not present

## 2016-09-30 DIAGNOSIS — E669 Obesity, unspecified: Secondary | ICD-10-CM | POA: Diagnosis not present

## 2016-09-30 DIAGNOSIS — I10 Essential (primary) hypertension: Secondary | ICD-10-CM | POA: Diagnosis not present

## 2016-09-30 DIAGNOSIS — Z7901 Long term (current) use of anticoagulants: Secondary | ICD-10-CM | POA: Diagnosis not present

## 2016-09-30 NOTE — Addendum Note (Signed)
Addended by: Meyer Cory on: 09/30/2016 04:00 PM   Modules accepted: Orders

## 2016-09-30 NOTE — Progress Notes (Signed)
Office Visit Note   Patient: [name]           Date of Birth: [birth year]           MRN: [id number] Visit Date: 09/30/2016              Requested by: Renee Bis, MD [place], [place] [address] PCP: Renee Ponto, MD   Assessment & Plan: Visit Diagnoses: 1 week status post right total knee replacement  Plan: Remove every other staple right knee, continue xarelto, obtain Doppler study for possible DVT right calf, office 1 week  Follow-Up Instructions: No Follow-up on file.   Orders:  No orders of the defined types were placed in this encounter.  No orders of the defined types were placed in this encounter.     Procedures: No procedures performed   Clinical Data: No additional findings.   Subjective: Chief Complaint  Patient presents with  . Right Knee - Routine Post Op    Patient returns for first post op visit. She is status post right total knee arthroplasty on 09/22/2016.  She is 8 days post op. She is having right calf pain and some numbness in her toes. She is taking Oxycodone and Methocarbamol. She is ambulating with a walker today. She continues to take Xarelto.  [name] was noted to have decreased sensibility in her right foot in a stocking glove distribution postoperatively associated with some weakness of the great toe extensor. I could not find an obvious reason. She had good pulses and normal motor exam other than the above. Since she was discharged she's had an issue with home health physical therapy. [facility] will start tomorrow. She has been performing exercises on her own and actually doing quite well. She denies shortness of breath or chest pain. She does feel like she's had some sensory return particularly in the plantar aspect of her foot  Review of Systems   Objective: Vital Signs: There were no vitals taken for this visit.  Physical Exam  Ortho Exam right knee exam reveals incision to be healing nicely. Every other  staple was removed. Minimal effusion. No evidence of instability. Normal sensibility about the knee. No popliteal pain. Some mild calf discomfort but no swelling or induration. No ankle swelling or foot discomfort. The extensor hallucis longus which was week postoperatively is now normal. All the other motors are intact. She still has some altered sensibility in the dorsal and plantar aspect of her foot in a stocking glove distribution.  Specialty Comments:  No specialty comments available.  Imaging: No results found.   PMFS History: Patient Active Problem List   Diagnosis Date Noted  . S/P total knee replacement using cement, right 09/22/2016  . Unilateral primary osteoarthritis, right knee 09/09/2016  . Osteoarthritis of left knee 09/20/2013  . Obesity, unspecified 09/20/2013  . S/P total knee replacement using cement 09/19/2013   Past Medical History:  Diagnosis Date  . Arthritis   . Cancer ([place])    melanoma back  . Complication of anesthesia    ? bp- passed out after in room.   Marland Kitchen Hypertension   . PONV (postoperative nausea and vomiting)     No family history on file.  Past Surgical History:  Procedure Laterality Date  . APPENDECTOMY  80  . HAND SURGERY Right 06   foreign object  . KNEE ARTHROSCOPY Left 82,84  . KNEE ARTHROSCOPY Right 10  . KNEE SURGERY Left 83   ligament  .  TOTAL KNEE ARTHROPLASTY Left 09/19/2013   Procedure: TOTAL KNEE ARTHROPLASTY;  Surgeon: Renee Balding, MD;  Location: [facility];  Service: Orthopedics;  Laterality: Left;  . TOTAL KNEE ARTHROPLASTY Right 09/22/2016   Procedure: RIGHT TOTAL KNEE ARTHROPLASTY;  Surgeon: Renee Balding, MD;  Location: [facility];  Service: Orthopedics;  Laterality: Right;  . TUBAL LIGATION  75   Social History   Occupational History  . Not on file.   Social History Main Topics  . Smoking status: Former Smoker    Packs/day: 2.00    Years: 20.00    Types: Cigarettes    Quit date: 07/10/1993  . Smokeless tobacco:  Never Used  . Alcohol use Yes     Comment: occ wine  . Drug use: No  . Sexual activity: Not on file

## 2016-09-30 NOTE — Addendum Note (Signed)
Addended by: Meyer Cory on: 09/30/2016 02:20 PM   Modules accepted: Orders

## 2016-09-30 NOTE — Telephone Encounter (Signed)
Order entered for Vascular US to rule out DVT

## 2016-10-01 ENCOUNTER — Ambulatory Visit (HOSPITAL_COMMUNITY)
Admission: RE | Admit: 2016-10-01 | Discharge: 2016-10-01 | Disposition: A | Discharge status: Home / Self Care | Payer: Medicare HMO | Source: Ambulatory Visit | Attending: Orthopaedic Surgery | Admitting: Orthopaedic Surgery

## 2016-10-01 ENCOUNTER — Telehealth (INDEPENDENT_AMBULATORY_CARE_PROVIDER_SITE_OTHER): Payer: Self-pay | Admitting: Orthopaedic Surgery

## 2016-10-01 ENCOUNTER — Telehealth (INDEPENDENT_AMBULATORY_CARE_PROVIDER_SITE_OTHER): Payer: Self-pay | Admitting: *Deleted

## 2016-10-01 DIAGNOSIS — M7989 Other specified soft tissue disorders: Secondary | ICD-10-CM | POA: Diagnosis not present

## 2016-10-01 DIAGNOSIS — Z79891 Long term (current) use of opiate analgesic: Secondary | ICD-10-CM | POA: Diagnosis not present

## 2016-10-01 DIAGNOSIS — R2241 Localized swelling, mass and lump, right lower limb: Secondary | ICD-10-CM | POA: Diagnosis not present

## 2016-10-01 DIAGNOSIS — Z96651 Presence of right artificial knee joint: Secondary | ICD-10-CM | POA: Diagnosis not present

## 2016-10-01 DIAGNOSIS — Z7901 Long term (current) use of anticoagulants: Secondary | ICD-10-CM | POA: Diagnosis not present

## 2016-10-01 DIAGNOSIS — Z471 Aftercare following joint replacement surgery: Secondary | ICD-10-CM | POA: Diagnosis not present

## 2016-10-01 DIAGNOSIS — E669 Obesity, unspecified: Secondary | ICD-10-CM | POA: Diagnosis not present

## 2016-10-01 DIAGNOSIS — I10 Essential (primary) hypertension: Secondary | ICD-10-CM | POA: Diagnosis not present

## 2016-10-01 NOTE — Telephone Encounter (Signed)
Spoke with [facility] and they saw her on Wednesday.

## 2016-10-01 NOTE — Telephone Encounter (Signed)
[name] calling from [facility] for stat report on patients venous doppler. Test came back negative and they have released patient.

## 2016-10-01 NOTE — Telephone Encounter (Signed)
Please fax orders for Ultrasound to  fax# [phone] or have Dr. [name] sign in [place] please. Patient has scheduled appointment for tomorrow per [facility] scheduling.

## 2016-10-01 NOTE — Telephone Encounter (Signed)
[facility] scheduling called this afternoon in regards to needing orders for this patient's ultrasound venous. [facility] CB # [phone]. Fax number [phone]. Thank you

## 2016-10-02 ENCOUNTER — Ambulatory Visit (HOSPITAL_COMMUNITY): Admission: RE | Admit: 2016-10-02 | Payer: Medicare HMO | Source: Ambulatory Visit

## 2016-10-02 DIAGNOSIS — Z96651 Presence of right artificial knee joint: Secondary | ICD-10-CM | POA: Diagnosis not present

## 2016-10-02 DIAGNOSIS — Z471 Aftercare following joint replacement surgery: Secondary | ICD-10-CM | POA: Diagnosis not present

## 2016-10-02 DIAGNOSIS — Z7901 Long term (current) use of anticoagulants: Secondary | ICD-10-CM | POA: Diagnosis not present

## 2016-10-02 DIAGNOSIS — E669 Obesity, unspecified: Secondary | ICD-10-CM | POA: Diagnosis not present

## 2016-10-02 DIAGNOSIS — I10 Essential (primary) hypertension: Secondary | ICD-10-CM | POA: Diagnosis not present

## 2016-10-02 DIAGNOSIS — Z79891 Long term (current) use of opiate analgesic: Secondary | ICD-10-CM | POA: Diagnosis not present

## 2016-10-05 DIAGNOSIS — E669 Obesity, unspecified: Secondary | ICD-10-CM | POA: Diagnosis not present

## 2016-10-05 DIAGNOSIS — Z7901 Long term (current) use of anticoagulants: Secondary | ICD-10-CM | POA: Diagnosis not present

## 2016-10-05 DIAGNOSIS — Z471 Aftercare following joint replacement surgery: Secondary | ICD-10-CM | POA: Diagnosis not present

## 2016-10-05 DIAGNOSIS — I10 Essential (primary) hypertension: Secondary | ICD-10-CM | POA: Diagnosis not present

## 2016-10-05 DIAGNOSIS — Z79891 Long term (current) use of opiate analgesic: Secondary | ICD-10-CM | POA: Diagnosis not present

## 2016-10-05 DIAGNOSIS — Z96651 Presence of right artificial knee joint: Secondary | ICD-10-CM | POA: Diagnosis not present

## 2016-10-07 ENCOUNTER — Ambulatory Visit (INDEPENDENT_AMBULATORY_CARE_PROVIDER_SITE_OTHER): Payer: Medicare HMO | Admitting: Orthopaedic Surgery

## 2016-10-07 ENCOUNTER — Encounter (INDEPENDENT_AMBULATORY_CARE_PROVIDER_SITE_OTHER): Payer: Self-pay | Admitting: Orthopaedic Surgery

## 2016-10-07 VITALS — BP 127/60 | HR 63

## 2016-10-07 DIAGNOSIS — E669 Obesity, unspecified: Secondary | ICD-10-CM | POA: Diagnosis not present

## 2016-10-07 DIAGNOSIS — Z79891 Long term (current) use of opiate analgesic: Secondary | ICD-10-CM | POA: Diagnosis not present

## 2016-10-07 DIAGNOSIS — Z96651 Presence of right artificial knee joint: Secondary | ICD-10-CM | POA: Diagnosis not present

## 2016-10-07 DIAGNOSIS — Z471 Aftercare following joint replacement surgery: Secondary | ICD-10-CM | POA: Diagnosis not present

## 2016-10-07 DIAGNOSIS — Z7901 Long term (current) use of anticoagulants: Secondary | ICD-10-CM | POA: Diagnosis not present

## 2016-10-07 DIAGNOSIS — I10 Essential (primary) hypertension: Secondary | ICD-10-CM | POA: Diagnosis not present

## 2016-10-07 MED ORDER — METHOCARBAMOL 500 MG PO TABS: 500.0000 mg | ORAL_TABLET | Freq: Four times a day (QID) | ORAL | 0 refills | Status: DC | PRN

## 2016-10-07 MED ORDER — OXYCODONE HCL 5 MG PO TABS: 5.0000 mg | ORAL_TABLET | Freq: Three times a day (TID) | ORAL | 0 refills | Status: DC | PRN

## 2016-10-07 NOTE — Progress Notes (Signed)
Office Visit Note   Patient: [name]           Date of Birth: 06-Dec-1950           MRN: [id number] Visit Date: 10/07/2016              Requested by: Caryl Bis, MD [place], [place] [address] PCP: Gar Ponto, MD   Assessment & Plan: Visit Diagnoses: 2 weeks status post primary right total knee replacement. Doing well with physical therapy and independent with walker I think she's had a neuropraxia to the distal tibial and peroneal nerves to the right foot. Appears that she's having some return of function based on her numbness and tingling. Plan: Remove remaining staples from right knee incision, apply Steri-Strips, weight-bear as tolerated with a walker or cane. Start outpatient physical therapy and renew Robaxin and OxyIR. Office 2 weeks  Follow-Up Instructions: No Follow-up on file.   Orders:  No orders of the defined types were placed in this encounter.  No orders of the defined types were placed in this encounter.     Procedures: No procedures performed   Clinical Data: No additional findings.   Subjective: No chief complaint on file.   Patient returns for second post op visit. She is status post right total knee arthroplasty on 09/22/2016.   She is having right calf pain and some numbness in her toes. She is taking Oxycodone and Methocarbamol. She is ambulating with a walker today.   She is completing her in home PT and will need to go to outpatient pt. [facility] therapy  [name] is doing well in terms of her therapy and walking. The Doppler study was negative for DVT. She still is experiencing some numbness in her right foot but has absolutely no loss of motor function. She is beginning to experience tingling along the lateral aspect of her foot. Still having some numbness along the dorsomedial aspect of her foot and plantarly. I wonder if she did not have an increased stretch around her ankle at the time of surgery to only effect the very distal  portion of sensory nerves  Review of Systems   Objective: Vital Signs: There were no vitals taken for this visit.  Physical Exam  Ortho Exam right knee wound is healing nicely without evidence of infection. We'll remove the staples and apply Steri-Strips over benzoin. I can just about fully extend the knee and flex about 95. I hope she'll be able to flex more after the staples are removed. She finishes her course of physical therapy will start outpatient therapy at [facility] next week. Present calf pain. No lower extremity swelling. Good pulses in her foot. Does have tingling in the lateral 2 with 3 toes but the first and second are still numb dorsally. Still having loss of feeling in the plantar aspect of her foot. Foot is nice and warm with good capillary refill. All motors are intact and strong area and  Specialty Comments:  No specialty comments available.  Imaging: No results found.   PMFS History: Patient Active Problem List   Diagnosis Date Noted  . S/P total knee replacement using cement, right 09/22/2016  . Unilateral primary osteoarthritis, right knee 09/09/2016  . Osteoarthritis of left knee 09/20/2013  . Obesity, unspecified 09/20/2013  . S/P total knee replacement using cement 09/19/2013   Past Medical History:  Diagnosis Date  . Arthritis   . Cancer ([place])    melanoma back  .  Complication of anesthesia    ? bp- passed out after in room.   Marland Kitchen Hypertension   . PONV (postoperative nausea and vomiting)     No family history on file.  Past Surgical History:  Procedure Laterality Date  . APPENDECTOMY  80  . HAND SURGERY Right 06   foreign object  . KNEE ARTHROSCOPY Left 82,84  . KNEE ARTHROSCOPY Right 10  . KNEE SURGERY Left 83   ligament  . TOTAL KNEE ARTHROPLASTY Left 09/19/2013   Procedure: TOTAL KNEE ARTHROPLASTY;  Surgeon: Garald Balding, MD;  Location: [facility];  Service: Orthopedics;  Laterality: Left;  . TOTAL KNEE ARTHROPLASTY Right 09/22/2016    Procedure: RIGHT TOTAL KNEE ARTHROPLASTY;  Surgeon: Garald Balding, MD;  Location: [facility];  Service: Orthopedics;  Laterality: Right;  . TUBAL LIGATION  75   Social History   Occupational History  . Not on file.   Social History Main Topics  . Smoking status: Former Smoker    Packs/day: 2.00    Years: 20.00    Types: Cigarettes    Quit date: 07/10/1993  . Smokeless tobacco: Never Used  . Alcohol use Yes     Comment: occ wine  . Drug use: No  . Sexual activity: Not on file

## 2016-10-09 DIAGNOSIS — E669 Obesity, unspecified: Secondary | ICD-10-CM | POA: Diagnosis not present

## 2016-10-09 DIAGNOSIS — Z96651 Presence of right artificial knee joint: Secondary | ICD-10-CM | POA: Diagnosis not present

## 2016-10-09 DIAGNOSIS — Z471 Aftercare following joint replacement surgery: Secondary | ICD-10-CM | POA: Diagnosis not present

## 2016-10-09 DIAGNOSIS — Z79891 Long term (current) use of opiate analgesic: Secondary | ICD-10-CM | POA: Diagnosis not present

## 2016-10-09 DIAGNOSIS — Z7901 Long term (current) use of anticoagulants: Secondary | ICD-10-CM | POA: Diagnosis not present

## 2016-10-09 DIAGNOSIS — I10 Essential (primary) hypertension: Secondary | ICD-10-CM | POA: Diagnosis not present

## 2016-10-14 ENCOUNTER — Telehealth (INDEPENDENT_AMBULATORY_CARE_PROVIDER_SITE_OTHER): Payer: Self-pay | Admitting: Orthopaedic Surgery

## 2016-10-14 DIAGNOSIS — M25661 Stiffness of right knee, not elsewhere classified: Secondary | ICD-10-CM | POA: Diagnosis not present

## 2016-10-14 DIAGNOSIS — M25461 Effusion, right knee: Secondary | ICD-10-CM | POA: Diagnosis not present

## 2016-10-14 DIAGNOSIS — M25561 Pain in right knee: Secondary | ICD-10-CM | POA: Diagnosis not present

## 2016-10-14 NOTE — Telephone Encounter (Signed)
[facility] would like the op note and any protocols faxed over to them for patient. Fax# [phone]

## 2016-10-16 DIAGNOSIS — M25561 Pain in right knee: Secondary | ICD-10-CM | POA: Diagnosis not present

## 2016-10-16 DIAGNOSIS — M25661 Stiffness of right knee, not elsewhere classified: Secondary | ICD-10-CM | POA: Diagnosis not present

## 2016-10-16 DIAGNOSIS — M25461 Effusion, right knee: Secondary | ICD-10-CM | POA: Diagnosis not present

## 2016-10-20 DIAGNOSIS — M25561 Pain in right knee: Secondary | ICD-10-CM | POA: Diagnosis not present

## 2016-10-20 DIAGNOSIS — M25461 Effusion, right knee: Secondary | ICD-10-CM | POA: Diagnosis not present

## 2016-10-20 DIAGNOSIS — M25661 Stiffness of right knee, not elsewhere classified: Secondary | ICD-10-CM | POA: Diagnosis not present

## 2016-10-20 NOTE — Telephone Encounter (Signed)
done

## 2016-10-20 NOTE — Telephone Encounter (Signed)
Have [facility] talk to Dr [name] tomorrow at [facility]

## 2016-10-21 ENCOUNTER — Ambulatory Visit (INDEPENDENT_AMBULATORY_CARE_PROVIDER_SITE_OTHER): Payer: Medicare HMO | Admitting: Orthopaedic Surgery

## 2016-10-21 VITALS — BP 140/75 | HR 69

## 2016-10-21 DIAGNOSIS — Z96651 Presence of right artificial knee joint: Secondary | ICD-10-CM

## 2016-10-21 NOTE — Progress Notes (Signed)
   Office Visit Note   Patient: [name]           Date of Birth: 12/22/1950           MRN: [id number] Visit Date: 10/21/2016              Requested by: Caryl Bis, MD [place], [place] [address] PCP: Gar Ponto, MD   Assessment & Plan: Visit Diagnoses: 1 month status post right total knee replacement-doing well  Plan: Discontinue cane, continue with exercises and strengthening, return to office 1 month   Follow-Up Instructions: No Follow-up on file.   Orders:  No orders of the defined types were placed in this encounter.  No orders of the defined types were placed in this encounter.     Procedures: No procedures performed   Clinical Data: No additional findings.   Subjective: Chief Complaint  Patient presents with  . Right Knee - Routine Post Op    Post op visit right knee,4 weeks s/p total knee replacement, patient states she is doing well in regards to her knee. She does still complain of numbness of her right foot, but states it is improving slowly.     Review of Systems   Objective: Vital Signs: There were no vitals taken for this visit.  Physical Exam  Ortho Exam Right knee with full extension and 104 of flexion with a goniometer. Incision is healed very nicely. No instability. Excellent alignment. Incision healing nicely. Altered sensibility on the dorsum and plantar aspect right foot area but, definitely improving. Motor exam perfectly intact. Excellent pulses. Specialty Comments:  No specialty comments available.  Imaging: No results found.   PMFS History: Patient Active Problem List   Diagnosis Date Noted  . S/P total knee replacement using cement, right 09/22/2016  . Unilateral primary osteoarthritis, right knee 09/09/2016  . Osteoarthritis of left knee 09/20/2013  . Obesity, unspecified 09/20/2013  . S/P total knee replacement using cement 09/19/2013   Past Medical History:  Diagnosis Date  . Arthritis   . Cancer  ([place])    melanoma back  . Complication of anesthesia    ? bp- passed out after in room.   Marland Kitchen Hypertension   . PONV (postoperative nausea and vomiting)     No family history on file.  Past Surgical History:  Procedure Laterality Date  . APPENDECTOMY  80  . HAND SURGERY Right 06   foreign object  . KNEE ARTHROSCOPY Left 82,84  . KNEE ARTHROSCOPY Right 10  . KNEE SURGERY Left 83   ligament  . TOTAL KNEE ARTHROPLASTY Left 09/19/2013   Procedure: TOTAL KNEE ARTHROPLASTY;  Surgeon: Garald Balding, MD;  Location: [facility];  Service: Orthopedics;  Laterality: Left;  . TOTAL KNEE ARTHROPLASTY Right 09/22/2016   Procedure: RIGHT TOTAL KNEE ARTHROPLASTY;  Surgeon: Garald Balding, MD;  Location: [facility];  Service: Orthopedics;  Laterality: Right;  . TUBAL LIGATION  75   Social History   Occupational History  . Not on file.   Social History Main Topics  . Smoking status: Former Smoker    Packs/day: 2.00    Years: 20.00    Types: Cigarettes    Quit date: 07/10/1993  . Smokeless tobacco: Never Used  . Alcohol use Yes     Comment: occ wine  . Drug use: No  . Sexual activity: Not on file

## 2016-10-22 DIAGNOSIS — M25461 Effusion, right knee: Secondary | ICD-10-CM | POA: Diagnosis not present

## 2016-10-22 DIAGNOSIS — M25561 Pain in right knee: Secondary | ICD-10-CM | POA: Diagnosis not present

## 2016-10-22 DIAGNOSIS — M25661 Stiffness of right knee, not elsewhere classified: Secondary | ICD-10-CM | POA: Diagnosis not present

## 2016-10-27 DIAGNOSIS — D1800 Hemangioma unspecified site: Secondary | ICD-10-CM | POA: Diagnosis not present

## 2016-10-27 DIAGNOSIS — M25661 Stiffness of right knee, not elsewhere classified: Secondary | ICD-10-CM | POA: Diagnosis not present

## 2016-10-27 DIAGNOSIS — M25461 Effusion, right knee: Secondary | ICD-10-CM | POA: Diagnosis not present

## 2016-10-27 DIAGNOSIS — M25561 Pain in right knee: Secondary | ICD-10-CM | POA: Diagnosis not present

## 2016-10-27 DIAGNOSIS — L570 Actinic keratosis: Secondary | ICD-10-CM | POA: Diagnosis not present

## 2016-10-29 DIAGNOSIS — M25661 Stiffness of right knee, not elsewhere classified: Secondary | ICD-10-CM | POA: Diagnosis not present

## 2016-10-29 DIAGNOSIS — M25461 Effusion, right knee: Secondary | ICD-10-CM | POA: Diagnosis not present

## 2016-10-29 DIAGNOSIS — M25561 Pain in right knee: Secondary | ICD-10-CM | POA: Diagnosis not present

## 2016-11-02 DIAGNOSIS — M25461 Effusion, right knee: Secondary | ICD-10-CM | POA: Diagnosis not present

## 2016-11-02 DIAGNOSIS — M25661 Stiffness of right knee, not elsewhere classified: Secondary | ICD-10-CM | POA: Diagnosis not present

## 2016-11-02 DIAGNOSIS — M25561 Pain in right knee: Secondary | ICD-10-CM | POA: Diagnosis not present

## 2016-11-05 DIAGNOSIS — M25561 Pain in right knee: Secondary | ICD-10-CM | POA: Diagnosis not present

## 2016-11-05 DIAGNOSIS — M25461 Effusion, right knee: Secondary | ICD-10-CM | POA: Diagnosis not present

## 2016-11-05 DIAGNOSIS — M25661 Stiffness of right knee, not elsewhere classified: Secondary | ICD-10-CM | POA: Diagnosis not present

## 2016-11-09 DIAGNOSIS — M25661 Stiffness of right knee, not elsewhere classified: Secondary | ICD-10-CM | POA: Diagnosis not present

## 2016-11-09 DIAGNOSIS — M25561 Pain in right knee: Secondary | ICD-10-CM | POA: Diagnosis not present

## 2016-11-09 DIAGNOSIS — M25461 Effusion, right knee: Secondary | ICD-10-CM | POA: Diagnosis not present

## 2016-11-12 DIAGNOSIS — M25561 Pain in right knee: Secondary | ICD-10-CM | POA: Diagnosis not present

## 2016-11-12 DIAGNOSIS — M25461 Effusion, right knee: Secondary | ICD-10-CM | POA: Diagnosis not present

## 2016-11-12 DIAGNOSIS — M25661 Stiffness of right knee, not elsewhere classified: Secondary | ICD-10-CM | POA: Diagnosis not present

## 2016-11-17 DIAGNOSIS — M25461 Effusion, right knee: Secondary | ICD-10-CM | POA: Diagnosis not present

## 2016-11-17 DIAGNOSIS — M25561 Pain in right knee: Secondary | ICD-10-CM | POA: Diagnosis not present

## 2016-11-17 DIAGNOSIS — M25661 Stiffness of right knee, not elsewhere classified: Secondary | ICD-10-CM | POA: Diagnosis not present

## 2016-11-18 ENCOUNTER — Encounter (INDEPENDENT_AMBULATORY_CARE_PROVIDER_SITE_OTHER): Payer: Self-pay | Admitting: Orthopaedic Surgery

## 2016-11-18 ENCOUNTER — Ambulatory Visit (INDEPENDENT_AMBULATORY_CARE_PROVIDER_SITE_OTHER): Payer: Medicare HMO | Admitting: Orthopaedic Surgery

## 2016-11-18 VITALS — BP 115/61 | HR 72 | Ht 60.0 in | Wt 150.0 lb

## 2016-11-18 DIAGNOSIS — Z96651 Presence of right artificial knee joint: Secondary | ICD-10-CM

## 2016-11-18 NOTE — Progress Notes (Signed)
   Office Visit Note   Patient: [name]           Date of Birth: Aug 21, 1951           MRN: [id number] Visit Date: 11/18/2016              Requested by: Caryl Bis, MD [place], [place] [address] PCP: Gar Ponto, MD   Assessment & Plan: Visit Diagnoses:2 mos s/p rightTKR-doing well with some residual numbness in peroneal nerve distribution between great and second toes with advancing tinel's  Plan: continue exercises, office 2 months  Follow-Up Instructions: No Follow-up on file.   Orders:  No orders of the defined types were placed in this encounter.  No orders of the defined types were placed in this encounter.     Procedures: No procedures performed   Clinical Data: No additional findings.   Subjective: Chief Complaint  Patient presents with  . Right Knee - Routine Post Op    [name] is 8 weeks status post Left total knee replacement. Pt relates her left foot is numb.  sensation has returned to lateral 3 toes.Still with numbness between great and second toes with advancing tinel's  Review of Systems   Objective: Vital Signs: There were no vitals taken for this visit.  Physical Exam  Ortho Exam ROM right knee -4 to 105, no swelling, no instability or localized pain. Foot exam as above-motors intact Specialty Comments:  No specialty comments available.  Imaging: No results found.   PMFS History: Patient Active Problem List   Diagnosis Date Noted  . S/P total knee replacement using cement, right 09/22/2016  . Unilateral primary osteoarthritis, right knee 09/09/2016  . Osteoarthritis of left knee 09/20/2013  . Obesity, unspecified 09/20/2013  . S/P total knee replacement using cement 09/19/2013   Past Medical History:  Diagnosis Date  . Arthritis   . Cancer ([place])    melanoma back  . Complication of anesthesia    ? bp- passed out after in room.   Marland Kitchen Hypertension   . PONV (postoperative nausea and vomiting)     No family  history on file.  Past Surgical History:  Procedure Laterality Date  . APPENDECTOMY  80  . HAND SURGERY Right 06   foreign object  . KNEE ARTHROSCOPY Left 82,84  . KNEE ARTHROSCOPY Right 10  . KNEE SURGERY Left 83   ligament  . TOTAL KNEE ARTHROPLASTY Left 09/19/2013   Procedure: TOTAL KNEE ARTHROPLASTY;  Surgeon: Garald Balding, MD;  Location: [facility];  Service: Orthopedics;  Laterality: Left;  . TOTAL KNEE ARTHROPLASTY Right 09/22/2016   Procedure: RIGHT TOTAL KNEE ARTHROPLASTY;  Surgeon: Garald Balding, MD;  Location: [facility];  Service: Orthopedics;  Laterality: Right;  . TUBAL LIGATION  75   Social History   Occupational History  . Not on file.   Social History Main Topics  . Smoking status: Former Smoker    Packs/day: 2.00    Years: 20.00    Types: Cigarettes    Quit date: 07/10/1993  . Smokeless tobacco: Never Used  . Alcohol use Yes     Comment: occ wine  . Drug use: No  . Sexual activity: Not on file

## 2017-01-20 ENCOUNTER — Ambulatory Visit (INDEPENDENT_AMBULATORY_CARE_PROVIDER_SITE_OTHER): Payer: Medicare HMO | Admitting: Orthopaedic Surgery

## 2017-01-20 ENCOUNTER — Encounter (INDEPENDENT_AMBULATORY_CARE_PROVIDER_SITE_OTHER): Payer: Self-pay | Admitting: Orthopaedic Surgery

## 2017-01-20 VITALS — BP 81/51 | HR 76 | Ht 60.0 in | Wt 150.0 lb

## 2017-01-20 DIAGNOSIS — Z96651 Presence of right artificial knee joint: Secondary | ICD-10-CM

## 2017-01-20 NOTE — Progress Notes (Signed)
Office Visit Note   Patient: [name]           Date of Birth: [birth year]           MRN: [id number] Visit Date: 01/20/2017              Requested by: Caryl Bis, MD [place], [place] [address] PCP: Caryl Bis, MD   Assessment & Plan: Visit Diagnoses:  1. History of total right knee replacement     Plan: Follow-up in 3 months  Follow-Up Instructions: Return in about 3 months (around 04/22/2017).   Orders:  No orders of the defined types were placed in this encounter.  No orders of the defined types were placed in this encounter.     Procedures: No procedures performed   Clinical Data: No additional findings.   Subjective: Chief Complaint  Patient presents with  . Right Knee - Routine Post Op    [name] is a 46 y o status post 4 months Right total knee repalcement. R foot is still numb since surgery.   [name] is 4 months status post primary right total knee replacement and very happy with the results. She did have an injury to the peroneal nerve and was experiencing numbness in the distribution of both the deep and superficial branches. She now has normal feeling in that the radial 2-3 toes of her right foot but still has some decreased sensibility in the first second and part of the third toes. He walks without ambulatory aide and doesn't take any pain medicine. She is really worked hard in therapy and wear the postop exercises  HPI  Review of Systems   Objective: Vital Signs: BP (!) 81/51   Pulse 76   Ht 5' (1.524 m)   Wt 150 lb (68 kg)   BMI 29.29 kg/m   Physical Exam  Ortho Exam right knee exam without evidence of infection or effusion or swelling. Incision is healed very nicely. No opening with a varus or valgus stress. Negative anterior drawer sign. Lacks just a few degrees to full extension but perception is full extension with ambulation. I measured 110 of flexion. Has a positive Tinel's at the ankle joint or tingling into  the medial 3 toes. I believe this is a good sign and consistent with healing of the sensory branches of the peroneal nerve. Motor exam is perfectly intact Imaging: No results found.   PMFS History: Patient Active Problem List   Diagnosis Date Noted  . S/P total knee replacement using cement, right 09/22/2016  . Unilateral primary osteoarthritis, right knee 09/09/2016  . Osteoarthritis of left knee 09/20/2013  . Obesity, unspecified 09/20/2013  . S/P total knee replacement using cement 09/19/2013   Past Medical History:  Diagnosis Date  . Arthritis   . Cancer ([place])    melanoma back  . Complication of anesthesia    ? bp- passed out after in room.   Marland Kitchen Hypertension   . PONV (postoperative nausea and vomiting)     History reviewed. No pertinent family history.  Past Surgical History:  Procedure Laterality Date  . APPENDECTOMY  80  . HAND SURGERY Right 06   foreign object  . KNEE ARTHROSCOPY Left 82,84  . KNEE ARTHROSCOPY Right 10  . KNEE SURGERY Left 83   ligament  . TOTAL KNEE ARTHROPLASTY Left 09/19/2013   Procedure: TOTAL KNEE ARTHROPLASTY;  Surgeon: Garald Balding, MD;  Location: [facility];  Service: Orthopedics;  Laterality:  Left;  . TOTAL KNEE ARTHROPLASTY Right 09/22/2016   Procedure: RIGHT TOTAL KNEE ARTHROPLASTY;  Surgeon: Garald Balding, MD;  Location: [facility];  Service: Orthopedics;  Laterality: Right;  . TUBAL LIGATION  75   Social History   Occupational History  . Not on file.   Social History Main Topics  . Smoking status: Former Smoker    Packs/day: 2.00    Years: 20.00    Types: Cigarettes    Quit date: 07/10/1993  . Smokeless tobacco: Never Used  . Alcohol use Yes     Comment: occ wine  . Drug use: No  . Sexual activity: Not on file     Garald Balding, MD   Note - This record has been created using [employer].  Chart creation errors have been sought, but may not always  have been located. Such creation errors do not reflect on  the  standard of medical care.

## 2017-02-05 NOTE — Addendum Note (Signed)
Addendum  created 02/05/17 0930 by Rica Koyanagi, MD   Sign clinical note

## 2017-02-09 DIAGNOSIS — R5383 Other fatigue: Secondary | ICD-10-CM | POA: Diagnosis not present

## 2017-02-09 DIAGNOSIS — E782 Mixed hyperlipidemia: Secondary | ICD-10-CM | POA: Diagnosis not present

## 2017-02-09 DIAGNOSIS — Z9189 Other specified personal risk factors, not elsewhere classified: Secondary | ICD-10-CM | POA: Diagnosis not present

## 2017-02-09 DIAGNOSIS — I10 Essential (primary) hypertension: Secondary | ICD-10-CM | POA: Diagnosis not present

## 2017-02-12 DIAGNOSIS — Z0001 Encounter for general adult medical examination with abnormal findings: Secondary | ICD-10-CM | POA: Diagnosis not present

## 2017-02-12 DIAGNOSIS — Z9189 Other specified personal risk factors, not elsewhere classified: Secondary | ICD-10-CM | POA: Diagnosis not present

## 2017-02-12 DIAGNOSIS — I10 Essential (primary) hypertension: Secondary | ICD-10-CM | POA: Diagnosis not present

## 2017-02-12 DIAGNOSIS — Z6828 Body mass index (BMI) 28.0-28.9, adult: Secondary | ICD-10-CM | POA: Diagnosis not present

## 2017-02-12 DIAGNOSIS — M1711 Unilateral primary osteoarthritis, right knee: Secondary | ICD-10-CM | POA: Diagnosis not present

## 2017-02-12 DIAGNOSIS — J301 Allergic rhinitis due to pollen: Secondary | ICD-10-CM | POA: Diagnosis not present

## 2017-02-12 DIAGNOSIS — Z1212 Encounter for screening for malignant neoplasm of rectum: Secondary | ICD-10-CM | POA: Diagnosis not present

## 2017-02-12 DIAGNOSIS — E782 Mixed hyperlipidemia: Secondary | ICD-10-CM | POA: Diagnosis not present

## 2017-04-07 ENCOUNTER — Encounter (INDEPENDENT_AMBULATORY_CARE_PROVIDER_SITE_OTHER): Payer: Self-pay | Admitting: Orthopaedic Surgery

## 2017-04-07 ENCOUNTER — Ambulatory Visit (INDEPENDENT_AMBULATORY_CARE_PROVIDER_SITE_OTHER): Payer: Medicare HMO | Admitting: Orthopaedic Surgery

## 2017-04-07 VITALS — BP 129/67 | HR 51 | Ht 62.0 in | Wt 160.0 lb

## 2017-04-07 DIAGNOSIS — Z96651 Presence of right artificial knee joint: Secondary | ICD-10-CM | POA: Diagnosis not present

## 2017-04-07 NOTE — Progress Notes (Signed)
Office Visit Note   Patient: [name]           Date of Birth: 11-26-1950           MRN: [id number] Visit Date: 04/07/2017              Requested by: Caryl Bis, MD [place],  [id number] PCP: Caryl Bis, MD   Assessment & Plan: Visit Diagnoses:  1. History of total right knee replacement   Chemere is doing well in terms of her right total knee replacement. She is working out at [employer]. She did have a neurologic problem with her ankle and foot postoperatively that I believe is a partial injury or compression to her branch of the peroneal nerve. She initially had numbness in the dorsum of her foot. Part of that has resolved and now she is having numbness only in the first dorsal webspace. Motor exam is been perfectly intact until recently when she's had some difficulty with extension of her great toe. I believe that that she's had some compression to the branches of the peroneal nerve. I think the sensory issue was slowly resolving I'm not sure why she is having the inability to dorsiflex her great toe when she that's only within the last month or so  Plan: Office 3 months, monitor neuropathy right lower extremity. No active treatment at this time as patient is already on gabapentin. Continue with strengthening exercises. Gabapentin for her primary care physician is made a difference in terms of some of the burning in her foot  Follow-Up Instructions: Return in about 3 months (around 07/08/2017).   Orders:  No orders of the defined types were placed in this encounter.  No orders of the defined types were placed in this encounter.     Procedures: No procedures performed   Clinical Data: No additional findings.   Subjective: Chief Complaint  Patient presents with  . Right Foot - Routine Post Op, Pain    [name] is a 66 y o that presents with continued R foot still numb, R TKA in January.Click in R knee 1st toe drop  [name] is doing well in terms  of her right total knee replacement without fever or chills or significant pain except on occasion after working out she'll have some pain along the medial parapatellar region. She's not experiencing any sensation of her knee slipping alone occasion she has some popping which I believe is probably the area of the polypropylene bearing. She's also had the numbness in her right foot as previously identified. Initially she was having numbness along the entire dorsum of her foot now the lateral 3 toes are fine she is only having some numbness in the deep branch of the peroneal nerve. Her motor exam was perfectly intact until just the last month or so when she is having some difficulty dorsiflexing her great toe she also was having some numbness around her heel postoperatively that also has resolved  HPI  Review of Systems   Objective: Vital Signs: BP 129/67   Pulse (!) 51   Ht 5\' 2"  (1.575 m)   Wt 160 lb (72.6 kg)   BMI 29.26 kg/m   Physical Exam  Ortho Exam right knee without effusion. Incision is healed nicely knee was not hot red or swollen. No instability. Flexed over about 110. No Tinel's over the sciatic nerve in the popliteal space or even over the peroneal nerve. There is a Tinel's about  2 inches distal to the ankle joint. Normal sensibility about the foot including the heel and the lateral 3 toes both dorsally and plantarly. The only exception is some decreased sensibility in the first dorsal webspace. She also was having difficulty with dorsiflexion or great toe. I actually can feel a little contraction of the extensor hallucis longus. Anterior tibial, lesser toe extension and the peroneal tendons are normal. Good capillary refill to toes. Good pulses.  Specialty Comments:  No specialty comments available.  Imaging: No results found.   PMFS History: Patient Active Problem List   Diagnosis Date Noted  . S/P total knee replacement using cement, right 09/22/2016  . Unilateral  primary osteoarthritis, right knee 09/09/2016  . Osteoarthritis of left knee 09/20/2013  . Obesity, unspecified 09/20/2013  . S/P total knee replacement using cement 09/19/2013   Past Medical History:  Diagnosis Date  . Arthritis   . Cancer ([place])    melanoma back  . Complication of anesthesia    ? bp- passed out after in room.   Marland Kitchen Hypertension   . PONV (postoperative nausea and vomiting)     History reviewed. No pertinent family history.  Past Surgical History:  Procedure Laterality Date  . APPENDECTOMY  80  . HAND SURGERY Right 06   foreign object  . KNEE ARTHROSCOPY Left 82,84  . KNEE ARTHROSCOPY Right 10  . KNEE SURGERY Left 83   ligament  . TOTAL KNEE ARTHROPLASTY Left 09/19/2013   Procedure: TOTAL KNEE ARTHROPLASTY;  Surgeon: Garald Balding, MD;  Location: [facility];  Service: Orthopedics;  Laterality: Left;  . TOTAL KNEE ARTHROPLASTY Right 09/22/2016   Procedure: RIGHT TOTAL KNEE ARTHROPLASTY;  Surgeon: Garald Balding, MD;  Location: [facility];  Service: Orthopedics;  Laterality: Right;  . TUBAL LIGATION  75   Social History   Occupational History  . Not on file.   Social History Main Topics  . Smoking status: Former Smoker    Packs/day: 2.00    Years: 20.00    Types: Cigarettes    Quit date: 07/10/1993  . Smokeless tobacco: Never Used  . Alcohol use Yes     Comment: occ wine  . Drug use: No  . Sexual activity: Not on file     Garald Balding, MD   Note - This record has been created using [employer].  Chart creation errors have been sought, but may not always  have been located. Such creation errors do not reflect on  the standard of medical care.

## 2017-06-03 DIAGNOSIS — I10 Essential (primary) hypertension: Secondary | ICD-10-CM | POA: Diagnosis not present

## 2017-06-03 DIAGNOSIS — Z23 Encounter for immunization: Secondary | ICD-10-CM | POA: Diagnosis not present

## 2017-06-03 DIAGNOSIS — Z6829 Body mass index (BMI) 29.0-29.9, adult: Secondary | ICD-10-CM | POA: Diagnosis not present

## 2017-07-14 DIAGNOSIS — R69 Illness, unspecified: Secondary | ICD-10-CM | POA: Diagnosis not present

## 2017-09-09 ENCOUNTER — Encounter: Payer: Self-pay | Admitting: Gastroenterology

## 2017-09-10 DIAGNOSIS — Z9189 Other specified personal risk factors, not elsewhere classified: Secondary | ICD-10-CM | POA: Diagnosis not present

## 2017-09-10 DIAGNOSIS — I10 Essential (primary) hypertension: Secondary | ICD-10-CM | POA: Diagnosis not present

## 2017-09-10 DIAGNOSIS — R5383 Other fatigue: Secondary | ICD-10-CM | POA: Diagnosis not present

## 2017-09-10 DIAGNOSIS — E782 Mixed hyperlipidemia: Secondary | ICD-10-CM | POA: Diagnosis not present

## 2017-09-15 DIAGNOSIS — Z23 Encounter for immunization: Secondary | ICD-10-CM | POA: Diagnosis not present

## 2017-09-15 DIAGNOSIS — Z6829 Body mass index (BMI) 29.0-29.9, adult: Secondary | ICD-10-CM | POA: Diagnosis not present

## 2017-09-15 DIAGNOSIS — Z1212 Encounter for screening for malignant neoplasm of rectum: Secondary | ICD-10-CM | POA: Diagnosis not present

## 2017-09-15 DIAGNOSIS — E782 Mixed hyperlipidemia: Secondary | ICD-10-CM | POA: Diagnosis not present

## 2017-09-15 DIAGNOSIS — M1711 Unilateral primary osteoarthritis, right knee: Secondary | ICD-10-CM | POA: Diagnosis not present

## 2017-09-15 DIAGNOSIS — M7062 Trochanteric bursitis, left hip: Secondary | ICD-10-CM | POA: Diagnosis not present

## 2017-09-15 DIAGNOSIS — J301 Allergic rhinitis due to pollen: Secondary | ICD-10-CM | POA: Diagnosis not present

## 2017-09-15 DIAGNOSIS — I10 Essential (primary) hypertension: Secondary | ICD-10-CM | POA: Diagnosis not present

## 2017-09-17 DIAGNOSIS — Z1231 Encounter for screening mammogram for malignant neoplasm of breast: Secondary | ICD-10-CM | POA: Diagnosis not present

## 2017-10-06 ENCOUNTER — Ambulatory Visit (AMBULATORY_SURGERY_CENTER): Payer: Self-pay

## 2017-10-06 VITALS — Ht 60.0 in | Wt 168.2 lb

## 2017-10-06 DIAGNOSIS — Z1211 Encounter for screening for malignant neoplasm of colon: Secondary | ICD-10-CM

## 2017-10-06 NOTE — Progress Notes (Signed)
Per pt, no allergies to soy or egg products.Pt not taking any weight loss meds or using  O2 at home.  Pt refused emmi video. 

## 2017-10-07 ENCOUNTER — Encounter: Payer: Self-pay | Admitting: Gastroenterology

## 2017-10-20 ENCOUNTER — Ambulatory Visit (AMBULATORY_SURGERY_CENTER): Payer: Medicare HMO | Admitting: Gastroenterology

## 2017-10-20 ENCOUNTER — Encounter: Payer: Self-pay | Admitting: Gastroenterology

## 2017-10-20 ENCOUNTER — Other Ambulatory Visit: Payer: Self-pay

## 2017-10-20 VITALS — BP 117/54 | HR 56 | Temp 96.6°F | Resp 19 | Ht 60.0 in | Wt 168.0 lb

## 2017-10-20 DIAGNOSIS — Z1211 Encounter for screening for malignant neoplasm of colon: Secondary | ICD-10-CM | POA: Diagnosis not present

## 2017-10-20 DIAGNOSIS — Z1212 Encounter for screening for malignant neoplasm of rectum: Secondary | ICD-10-CM

## 2017-10-20 MED ORDER — SODIUM CHLORIDE 0.9 % IV SOLN: 500.0000 mL | Freq: Once | INTRAVENOUS | Status: AC

## 2017-10-20 NOTE — Patient Instructions (Signed)
Discharge instructions given. Handouts on diverticulosis and hemorrhoids. Resume previous medications. YOU HAD AN ENDOSCOPIC PROCEDURE TODAY AT THE [facility]:   Refer to the procedure report that was given to you for any specific questions about what was found during the examination.  If the procedure report does not answer your questions, please call your gastroenterologist to clarify.  If you requested that your care partner not be given the details of your procedure findings, then the procedure report has been included in a sealed envelope for you to review at your convenience later.  YOU SHOULD EXPECT: Some feelings of bloating in the abdomen. Passage of more gas than usual.  Walking can help get rid of the air that was put into your GI tract during the procedure and reduce the bloating. If you had a lower endoscopy (such as a colonoscopy or flexible sigmoidoscopy) you may notice spotting of blood in your stool or on the toilet paper. If you underwent a bowel prep for your procedure, you may not have a normal bowel movement for a few days.  Please Note:  You might notice some irritation and congestion in your nose or some drainage.  This is from the oxygen used during your procedure.  There is no need for concern and it should clear up in a day or so.  SYMPTOMS TO REPORT IMMEDIATELY:   Following lower endoscopy (colonoscopy or flexible sigmoidoscopy):  Excessive amounts of blood in the stool  Significant tenderness or worsening of abdominal pains  Swelling of the abdomen that is new, acute  Fever of 100F or higher   For urgent or emergent issues, a gastroenterologist can be reached at any hour by calling [phone].   DIET:  We do recommend a small meal at first, but then you may proceed to your regular diet.  Drink plenty of fluids but you should avoid alcoholic beverages for 24 hours.  ACTIVITY:  You should plan to take it easy for the rest of today and you should  NOT DRIVE or use heavy machinery until tomorrow (because of the sedation medicines used during the test).    FOLLOW UP: Our staff will call the number listed on your records the next business day following your procedure to check on you and address any questions or concerns that you may have regarding the information given to you following your procedure. If we do not reach you, we will leave a message.  However, if you are feeling well and you are not experiencing any problems, there is no need to return our call.  We will assume that you have returned to your regular daily activities without incident.  If any biopsies were taken you will be contacted by phone or by letter within the next 1-3 weeks.  Please call us at [phone] if you have not heard about the biopsies in 3 weeks.    SIGNATURES/CONFIDENTIALITY: You and/or your care partner have signed paperwork which will be entered into your electronic medical record.  These signatures attest to the fact that that the information above on your After Visit Summary has been reviewed and is understood.  Full responsibility of the confidentiality of this discharge information lies with you and/or your care-partner. 

## 2017-10-20 NOTE — Op Note (Signed)
[facility] Patient Name: [name] Procedure Date: 10/20/2017 9:02 AM MRN: [id number] Endoscopist: Mauri Pole , MD Age: 67 Referring MD:  Date of Birth: 1951/03/20 Gender: Female Account #: [id number] Procedure:                Colonoscopy Indications:              Screening for colorectal malignant neoplasm, Last                            colonoscopy 10 years ago at [facility],                            report is not available, was normal per patient. Medicines:                Monitored Anesthesia Care Procedure:                Pre-Anesthesia Assessment:                           - Prior to the procedure, a History and Physical                            was performed, and patient medications and                            allergies were reviewed. The patient's tolerance of                            previous anesthesia was also reviewed. The risks                            and benefits of the procedure and the sedation                            options and risks were discussed with the patient.                            All questions were answered, and informed consent                            was obtained. Prior Anticoagulants: The patient has                            taken no previous anticoagulant or antiplatelet                            agents. ASA Grade Assessment: II - A patient with                            mild systemic disease. After reviewing the risks                            and benefits, the patient was deemed in  satisfactory condition to undergo the procedure.                           After obtaining informed consent, the colonoscope                            was passed under direct vision. Throughout the                            procedure, the patient's blood pressure, pulse, and                            oxygen saturations were monitored continuously. The   Colonoscope was introduced through the anus and                            advanced to the the cecum, identified by                            appendiceal orifice and ileocecal valve. The                            colonoscopy was performed without difficulty. The                            patient tolerated the procedure well. The quality                            of the bowel preparation was excellent. The                            ileocecal valve, appendiceal orifice, and rectum                            were photographed. Scope In: 9:04:35 AM Scope Out: 9:21:26 AM Scope Withdrawal Time: 0 hours 13 minutes 8 seconds  Total Procedure Duration: 0 hours 16 minutes 51 seconds  Findings:                 The perianal and digital rectal examinations were                            normal.                           Scattered small and large-mouthed diverticula were                            found in the sigmoid colon, descending colon,                            transverse colon and ascending colon. There was                            narrowing of the colon in association with the  diverticular opening. There was evidence of an                            impacted diverticulum. There was no evidence of                            diverticular bleeding.                           Non-bleeding external and internal hemorrhoids were                            found during retroflexion. The hemorrhoids were                            medium-sized.                           The exam was otherwise without abnormality. Complications:            No immediate complications. Estimated Blood Loss:     Estimated blood loss: none. Impression:               - Moderate diverticulosis in the sigmoid colon, in                            the descending colon, in the transverse colon and                            in the ascending colon. There was narrowing of the                             colon in association with the diverticular opening.                            There was evidence of an impacted diverticulum.                            There was no evidence of diverticular bleeding.                           - Non-bleeding external and internal hemorrhoids.                           - The examination was otherwise normal.                           - No specimens collected. Recommendation:           - Patient has a contact number available for                            emergencies. The signs and symptoms of potential                            delayed complications were discussed with the  patient. Return to normal activities tomorrow.                            Written discharge instructions were provided to the                            patient.                           - Resume previous diet.                           - Continue present medications.                           - Repeat colonoscopy in 10 years for screening                            purposes. Mauri Pole, MD 10/20/2017 9:26:33 AM This report has been signed electronically.

## 2017-10-20 NOTE — Progress Notes (Signed)
A and O x3. Report to RN. Tolerated MAC anesthesia well.

## 2017-10-21 ENCOUNTER — Telehealth: Payer: Self-pay

## 2017-10-21 NOTE — Telephone Encounter (Signed)
  Follow up Call-  Call back number 10/20/2017  Post procedure Call Back phone  # [phone]  Permission to leave phone message Yes  Some recent data might be hidden     Patient questions:  Do you have a fever, pain , or abdominal swelling? No. Pain Score  0 *  Have you tolerated food without any problems? Yes.    Have you been able to return to your normal activities? Yes.    Do you have any questions about your discharge instructions: Diet   No. Medications  No. Follow up visit  No.  Do you have questions or concerns about your Care? No.  Actions: * If pain score is 4 or above: No action needed, pain <4.

## 2017-10-27 DIAGNOSIS — D485 Neoplasm of uncertain behavior of skin: Secondary | ICD-10-CM | POA: Diagnosis not present

## 2017-10-27 DIAGNOSIS — L570 Actinic keratosis: Secondary | ICD-10-CM | POA: Diagnosis not present

## 2017-10-27 DIAGNOSIS — L814 Other melanin hyperpigmentation: Secondary | ICD-10-CM | POA: Diagnosis not present

## 2017-10-27 DIAGNOSIS — L818 Other specified disorders of pigmentation: Secondary | ICD-10-CM | POA: Diagnosis not present

## 2017-10-27 DIAGNOSIS — D225 Melanocytic nevi of trunk: Secondary | ICD-10-CM | POA: Diagnosis not present

## 2018-01-24 DIAGNOSIS — Z6832 Body mass index (BMI) 32.0-32.9, adult: Secondary | ICD-10-CM | POA: Diagnosis not present

## 2018-01-24 DIAGNOSIS — Z833 Family history of diabetes mellitus: Secondary | ICD-10-CM | POA: Diagnosis not present

## 2018-01-24 DIAGNOSIS — Z8249 Family history of ischemic heart disease and other diseases of the circulatory system: Secondary | ICD-10-CM | POA: Diagnosis not present

## 2018-01-24 DIAGNOSIS — G629 Polyneuropathy, unspecified: Secondary | ICD-10-CM | POA: Diagnosis not present

## 2018-01-24 DIAGNOSIS — Z85820 Personal history of malignant melanoma of skin: Secondary | ICD-10-CM | POA: Diagnosis not present

## 2018-01-24 DIAGNOSIS — I10 Essential (primary) hypertension: Secondary | ICD-10-CM | POA: Diagnosis not present

## 2018-01-24 DIAGNOSIS — E669 Obesity, unspecified: Secondary | ICD-10-CM | POA: Diagnosis not present

## 2018-01-24 DIAGNOSIS — J309 Allergic rhinitis, unspecified: Secondary | ICD-10-CM | POA: Diagnosis not present

## 2018-01-24 DIAGNOSIS — Z809 Family history of malignant neoplasm, unspecified: Secondary | ICD-10-CM | POA: Diagnosis not present

## 2018-01-24 DIAGNOSIS — Z87891 Personal history of nicotine dependence: Secondary | ICD-10-CM | POA: Diagnosis not present

## 2018-03-03 DIAGNOSIS — Z9189 Other specified personal risk factors, not elsewhere classified: Secondary | ICD-10-CM | POA: Diagnosis not present

## 2018-03-03 DIAGNOSIS — E782 Mixed hyperlipidemia: Secondary | ICD-10-CM | POA: Diagnosis not present

## 2018-03-03 DIAGNOSIS — I10 Essential (primary) hypertension: Secondary | ICD-10-CM | POA: Diagnosis not present

## 2018-03-03 DIAGNOSIS — R5383 Other fatigue: Secondary | ICD-10-CM | POA: Diagnosis not present

## 2018-03-07 DIAGNOSIS — Z0001 Encounter for general adult medical examination with abnormal findings: Secondary | ICD-10-CM | POA: Diagnosis not present

## 2018-03-07 DIAGNOSIS — E782 Mixed hyperlipidemia: Secondary | ICD-10-CM | POA: Diagnosis not present

## 2018-03-07 DIAGNOSIS — G6289 Other specified polyneuropathies: Secondary | ICD-10-CM | POA: Diagnosis not present

## 2018-03-07 DIAGNOSIS — M1711 Unilateral primary osteoarthritis, right knee: Secondary | ICD-10-CM | POA: Diagnosis not present

## 2018-03-07 DIAGNOSIS — J301 Allergic rhinitis due to pollen: Secondary | ICD-10-CM | POA: Diagnosis not present

## 2018-03-07 DIAGNOSIS — I10 Essential (primary) hypertension: Secondary | ICD-10-CM | POA: Diagnosis not present

## 2018-03-07 DIAGNOSIS — Z6831 Body mass index (BMI) 31.0-31.9, adult: Secondary | ICD-10-CM | POA: Diagnosis not present

## 2018-05-17 DIAGNOSIS — R69 Illness, unspecified: Secondary | ICD-10-CM | POA: Diagnosis not present

## 2018-06-23 DIAGNOSIS — H40053 Ocular hypertension, bilateral: Secondary | ICD-10-CM | POA: Diagnosis not present

## 2018-06-23 DIAGNOSIS — H524 Presbyopia: Secondary | ICD-10-CM | POA: Diagnosis not present

## 2018-06-28 DIAGNOSIS — H2513 Age-related nuclear cataract, bilateral: Secondary | ICD-10-CM | POA: Diagnosis not present

## 2018-06-29 DIAGNOSIS — R69 Illness, unspecified: Secondary | ICD-10-CM | POA: Diagnosis not present

## 2018-09-12 DIAGNOSIS — I10 Essential (primary) hypertension: Secondary | ICD-10-CM | POA: Diagnosis not present

## 2018-09-12 DIAGNOSIS — E782 Mixed hyperlipidemia: Secondary | ICD-10-CM | POA: Diagnosis not present

## 2018-09-15 DIAGNOSIS — M1711 Unilateral primary osteoarthritis, right knee: Secondary | ICD-10-CM | POA: Diagnosis not present

## 2018-09-15 DIAGNOSIS — E782 Mixed hyperlipidemia: Secondary | ICD-10-CM | POA: Diagnosis not present

## 2018-09-15 DIAGNOSIS — I10 Essential (primary) hypertension: Secondary | ICD-10-CM | POA: Diagnosis not present

## 2018-09-15 DIAGNOSIS — G6289 Other specified polyneuropathies: Secondary | ICD-10-CM | POA: Diagnosis not present

## 2018-09-15 DIAGNOSIS — N183 Chronic kidney disease, stage 3 (moderate): Secondary | ICD-10-CM | POA: Diagnosis not present

## 2018-09-15 DIAGNOSIS — Z6830 Body mass index (BMI) 30.0-30.9, adult: Secondary | ICD-10-CM | POA: Diagnosis not present

## 2018-09-15 DIAGNOSIS — J301 Allergic rhinitis due to pollen: Secondary | ICD-10-CM | POA: Diagnosis not present

## 2018-11-09 DIAGNOSIS — L570 Actinic keratosis: Secondary | ICD-10-CM | POA: Diagnosis not present

## 2018-11-09 DIAGNOSIS — L818 Other specified disorders of pigmentation: Secondary | ICD-10-CM | POA: Diagnosis not present

## 2018-11-09 DIAGNOSIS — M67432 Ganglion, left wrist: Secondary | ICD-10-CM | POA: Diagnosis not present

## 2019-03-13 DIAGNOSIS — E782 Mixed hyperlipidemia: Secondary | ICD-10-CM | POA: Diagnosis not present

## 2019-03-13 DIAGNOSIS — R5383 Other fatigue: Secondary | ICD-10-CM | POA: Diagnosis not present

## 2019-03-13 DIAGNOSIS — I10 Essential (primary) hypertension: Secondary | ICD-10-CM | POA: Diagnosis not present

## 2019-03-13 DIAGNOSIS — N183 Chronic kidney disease, stage 3 (moderate): Secondary | ICD-10-CM | POA: Diagnosis not present

## 2019-03-15 DIAGNOSIS — M1711 Unilateral primary osteoarthritis, right knee: Secondary | ICD-10-CM | POA: Diagnosis not present

## 2019-03-15 DIAGNOSIS — I10 Essential (primary) hypertension: Secondary | ICD-10-CM | POA: Diagnosis not present

## 2019-03-15 DIAGNOSIS — Z6829 Body mass index (BMI) 29.0-29.9, adult: Secondary | ICD-10-CM | POA: Diagnosis not present

## 2019-03-15 DIAGNOSIS — J301 Allergic rhinitis due to pollen: Secondary | ICD-10-CM | POA: Diagnosis not present

## 2019-03-15 DIAGNOSIS — Z0001 Encounter for general adult medical examination with abnormal findings: Secondary | ICD-10-CM | POA: Diagnosis not present

## 2019-03-15 DIAGNOSIS — Z1212 Encounter for screening for malignant neoplasm of rectum: Secondary | ICD-10-CM | POA: Diagnosis not present

## 2019-03-15 DIAGNOSIS — N183 Chronic kidney disease, stage 3 (moderate): Secondary | ICD-10-CM | POA: Diagnosis not present

## 2019-03-15 DIAGNOSIS — E782 Mixed hyperlipidemia: Secondary | ICD-10-CM | POA: Diagnosis not present

## 2019-03-21 DIAGNOSIS — L570 Actinic keratosis: Secondary | ICD-10-CM | POA: Diagnosis not present

## 2019-03-21 DIAGNOSIS — L821 Other seborrheic keratosis: Secondary | ICD-10-CM | POA: Diagnosis not present

## 2019-03-21 DIAGNOSIS — L819 Disorder of pigmentation, unspecified: Secondary | ICD-10-CM | POA: Diagnosis not present

## 2019-03-29 DIAGNOSIS — R69 Illness, unspecified: Secondary | ICD-10-CM | POA: Diagnosis not present

## 2019-04-12 DIAGNOSIS — R5383 Other fatigue: Secondary | ICD-10-CM | POA: Diagnosis not present

## 2019-04-12 DIAGNOSIS — N183 Chronic kidney disease, stage 3 (moderate): Secondary | ICD-10-CM | POA: Diagnosis not present

## 2019-04-12 DIAGNOSIS — E782 Mixed hyperlipidemia: Secondary | ICD-10-CM | POA: Diagnosis not present

## 2019-04-12 DIAGNOSIS — I10 Essential (primary) hypertension: Secondary | ICD-10-CM | POA: Diagnosis not present

## 2019-06-06 DIAGNOSIS — L818 Other specified disorders of pigmentation: Secondary | ICD-10-CM | POA: Diagnosis not present

## 2019-06-06 DIAGNOSIS — D485 Neoplasm of uncertain behavior of skin: Secondary | ICD-10-CM | POA: Diagnosis not present

## 2019-06-06 DIAGNOSIS — L570 Actinic keratosis: Secondary | ICD-10-CM | POA: Diagnosis not present

## 2019-06-15 DIAGNOSIS — Z23 Encounter for immunization: Secondary | ICD-10-CM | POA: Diagnosis not present

## 2019-09-12 DIAGNOSIS — R5383 Other fatigue: Secondary | ICD-10-CM | POA: Diagnosis not present

## 2019-09-12 DIAGNOSIS — E782 Mixed hyperlipidemia: Secondary | ICD-10-CM | POA: Diagnosis not present

## 2019-09-12 DIAGNOSIS — I10 Essential (primary) hypertension: Secondary | ICD-10-CM | POA: Diagnosis not present

## 2019-09-15 DIAGNOSIS — Z6830 Body mass index (BMI) 30.0-30.9, adult: Secondary | ICD-10-CM | POA: Diagnosis not present

## 2019-09-15 DIAGNOSIS — E782 Mixed hyperlipidemia: Secondary | ICD-10-CM | POA: Diagnosis not present

## 2019-09-15 DIAGNOSIS — J301 Allergic rhinitis due to pollen: Secondary | ICD-10-CM | POA: Diagnosis not present

## 2019-09-15 DIAGNOSIS — I10 Essential (primary) hypertension: Secondary | ICD-10-CM | POA: Diagnosis not present

## 2019-09-15 DIAGNOSIS — G6289 Other specified polyneuropathies: Secondary | ICD-10-CM | POA: Diagnosis not present

## 2019-09-15 DIAGNOSIS — M1711 Unilateral primary osteoarthritis, right knee: Secondary | ICD-10-CM | POA: Diagnosis not present

## 2019-09-19 ENCOUNTER — Other Ambulatory Visit (HOSPITAL_COMMUNITY): Payer: Self-pay | Admitting: Family Medicine

## 2019-09-19 DIAGNOSIS — M810 Age-related osteoporosis without current pathological fracture: Secondary | ICD-10-CM

## 2019-09-27 ENCOUNTER — Other Ambulatory Visit (HOSPITAL_COMMUNITY): Payer: Self-pay | Admitting: Family Medicine

## 2019-09-27 ENCOUNTER — Ambulatory Visit (HOSPITAL_COMMUNITY)
Admission: RE | Admit: 2019-09-27 | Discharge: 2019-09-27 | Disposition: A | Discharge status: Home / Self Care | Payer: Medicare HMO | Source: Ambulatory Visit | Attending: Family Medicine | Admitting: Family Medicine

## 2019-09-27 ENCOUNTER — Other Ambulatory Visit: Payer: Self-pay

## 2019-09-27 DIAGNOSIS — Z1231 Encounter for screening mammogram for malignant neoplasm of breast: Secondary | ICD-10-CM

## 2019-09-27 DIAGNOSIS — M810 Age-related osteoporosis without current pathological fracture: Secondary | ICD-10-CM | POA: Insufficient documentation

## 2019-09-27 DIAGNOSIS — M85852 Other specified disorders of bone density and structure, left thigh: Secondary | ICD-10-CM | POA: Diagnosis not present

## 2019-10-02 ENCOUNTER — Ambulatory Visit: Payer: Medicare HMO | Attending: Internal Medicine

## 2019-10-02 DIAGNOSIS — Z23 Encounter for immunization: Secondary | ICD-10-CM | POA: Insufficient documentation

## 2019-10-02 NOTE — Progress Notes (Signed)
   Covid-19 Vaccination Clinic  Name:  [name]    MRN: [id number] DOB: [birth year]  10/02/2019  Ms. [name] was observed post Covid-19 immunization for 15 minutes without incidence. She was provided with Vaccine Information Sheet and instruction to access the V-Safe system.   [name] was instructed to call 911 with any severe reactions post vaccine: Marland Kitchen Difficulty breathing  . Swelling of your face and throat  . A fast heartbeat  . A bad rash all over your body  . Dizziness and weakness    Immunizations Administered    Name Date Dose VIS Date Route   Pfizer COVID-19 Vaccine 10/02/2019 10:58 AM 0.3 mL 08/18/2019 Intramuscular   Manufacturer: [place]   Lot: [id number]   [place]: [id number]

## 2019-10-05 ENCOUNTER — Ambulatory Visit (HOSPITAL_COMMUNITY)
Admission: RE | Admit: 2019-10-05 | Discharge: 2019-10-05 | Disposition: A | Discharge status: Home / Self Care | Payer: Medicare HMO | Source: Ambulatory Visit | Attending: Family Medicine | Admitting: Family Medicine

## 2019-10-05 ENCOUNTER — Other Ambulatory Visit: Payer: Self-pay

## 2019-10-05 DIAGNOSIS — Z1231 Encounter for screening mammogram for malignant neoplasm of breast: Secondary | ICD-10-CM | POA: Insufficient documentation

## 2019-10-20 ENCOUNTER — Ambulatory Visit: Payer: Medicare HMO | Attending: Internal Medicine

## 2019-10-20 DIAGNOSIS — Z23 Encounter for immunization: Secondary | ICD-10-CM | POA: Insufficient documentation

## 2019-10-20 NOTE — Progress Notes (Signed)
   Covid-19 Vaccination Clinic  Name:  [name]    MRN: [id number] DOB: December 21, 1950  10/20/2019  [name] was observed post Covid-19 immunization for 15 minutes without incidence. She was provided with Vaccine Information Sheet and instruction to access the V-Safe system.   [name] was instructed to call 911 with any severe reactions post vaccine: Marland Kitchen Difficulty breathing  . Swelling of your face and throat  . A fast heartbeat  . A bad rash all over your body  . Dizziness and weakness    Immunizations Administered    Name Date Dose VIS Date Route   Pfizer COVID-19 Vaccine 10/20/2019 10:45 AM 0.3 mL 08/18/2019 Intramuscular   Manufacturer: [place]   Lot: [id number]   [place]: [id number]

## 2019-11-07 DIAGNOSIS — L570 Actinic keratosis: Secondary | ICD-10-CM | POA: Diagnosis not present

## 2019-11-07 DIAGNOSIS — L819 Disorder of pigmentation, unspecified: Secondary | ICD-10-CM | POA: Diagnosis not present

## 2019-11-07 DIAGNOSIS — L821 Other seborrheic keratosis: Secondary | ICD-10-CM | POA: Diagnosis not present

## 2019-12-27 DIAGNOSIS — R69 Illness, unspecified: Secondary | ICD-10-CM | POA: Diagnosis not present

## 2020-01-15 DIAGNOSIS — R69 Illness, unspecified: Secondary | ICD-10-CM | POA: Diagnosis not present

## 2020-03-27 DIAGNOSIS — E782 Mixed hyperlipidemia: Secondary | ICD-10-CM | POA: Diagnosis not present

## 2020-03-27 DIAGNOSIS — Z0001 Encounter for general adult medical examination with abnormal findings: Secondary | ICD-10-CM | POA: Diagnosis not present

## 2020-03-27 DIAGNOSIS — R5383 Other fatigue: Secondary | ICD-10-CM | POA: Diagnosis not present

## 2020-03-27 DIAGNOSIS — N183 Chronic kidney disease, stage 3 unspecified: Secondary | ICD-10-CM | POA: Diagnosis not present

## 2020-03-27 DIAGNOSIS — I10 Essential (primary) hypertension: Secondary | ICD-10-CM | POA: Diagnosis not present

## 2020-04-03 DIAGNOSIS — G6289 Other specified polyneuropathies: Secondary | ICD-10-CM | POA: Diagnosis not present

## 2020-04-03 DIAGNOSIS — Z9189 Other specified personal risk factors, not elsewhere classified: Secondary | ICD-10-CM | POA: Diagnosis not present

## 2020-04-03 DIAGNOSIS — N1832 Chronic kidney disease, stage 3b: Secondary | ICD-10-CM | POA: Diagnosis not present

## 2020-04-03 DIAGNOSIS — Z23 Encounter for immunization: Secondary | ICD-10-CM | POA: Diagnosis not present

## 2020-04-03 DIAGNOSIS — M19011 Primary osteoarthritis, right shoulder: Secondary | ICD-10-CM | POA: Diagnosis not present

## 2020-04-03 DIAGNOSIS — Z1212 Encounter for screening for malignant neoplasm of rectum: Secondary | ICD-10-CM | POA: Diagnosis not present

## 2020-04-03 DIAGNOSIS — Z5181 Encounter for therapeutic drug level monitoring: Secondary | ICD-10-CM | POA: Diagnosis not present

## 2020-04-03 DIAGNOSIS — M1711 Unilateral primary osteoarthritis, right knee: Secondary | ICD-10-CM | POA: Diagnosis not present

## 2020-04-03 DIAGNOSIS — R69 Illness, unspecified: Secondary | ICD-10-CM | POA: Diagnosis not present

## 2020-04-03 DIAGNOSIS — I10 Essential (primary) hypertension: Secondary | ICD-10-CM | POA: Diagnosis not present

## 2020-04-03 DIAGNOSIS — Z0001 Encounter for general adult medical examination with abnormal findings: Secondary | ICD-10-CM | POA: Diagnosis not present

## 2020-04-03 DIAGNOSIS — Z6830 Body mass index (BMI) 30.0-30.9, adult: Secondary | ICD-10-CM | POA: Diagnosis not present

## 2020-04-03 DIAGNOSIS — E782 Mixed hyperlipidemia: Secondary | ICD-10-CM | POA: Diagnosis not present

## 2020-04-03 DIAGNOSIS — J301 Allergic rhinitis due to pollen: Secondary | ICD-10-CM | POA: Diagnosis not present

## 2020-05-01 DIAGNOSIS — M7501 Adhesive capsulitis of right shoulder: Secondary | ICD-10-CM | POA: Diagnosis not present

## 2020-05-06 DIAGNOSIS — M7501 Adhesive capsulitis of right shoulder: Secondary | ICD-10-CM | POA: Diagnosis not present

## 2020-05-08 DIAGNOSIS — M7501 Adhesive capsulitis of right shoulder: Secondary | ICD-10-CM | POA: Diagnosis not present

## 2020-05-15 ENCOUNTER — Other Ambulatory Visit: Payer: Self-pay | Admitting: Family Medicine

## 2020-05-15 ENCOUNTER — Other Ambulatory Visit (HOSPITAL_COMMUNITY): Payer: Self-pay | Admitting: Family Medicine

## 2020-05-15 DIAGNOSIS — M25511 Pain in right shoulder: Secondary | ICD-10-CM

## 2020-05-27 ENCOUNTER — Other Ambulatory Visit (HOSPITAL_COMMUNITY): Payer: Medicare HMO

## 2020-06-03 ENCOUNTER — Other Ambulatory Visit: Payer: Self-pay

## 2020-06-03 ENCOUNTER — Ambulatory Visit (HOSPITAL_COMMUNITY)
Admission: RE | Admit: 2020-06-03 | Discharge: 2020-06-03 | Disposition: A | Discharge status: Home / Self Care | Payer: Medicare HMO | Source: Ambulatory Visit | Attending: Family Medicine | Admitting: Family Medicine

## 2020-06-03 DIAGNOSIS — M25511 Pain in right shoulder: Secondary | ICD-10-CM | POA: Diagnosis not present

## 2020-06-06 ENCOUNTER — Encounter: Payer: Self-pay | Admitting: Orthopaedic Surgery

## 2020-06-06 ENCOUNTER — Ambulatory Visit: Payer: Self-pay

## 2020-06-06 ENCOUNTER — Ambulatory Visit (INDEPENDENT_AMBULATORY_CARE_PROVIDER_SITE_OTHER): Payer: Medicare HMO | Admitting: Orthopaedic Surgery

## 2020-06-06 ENCOUNTER — Other Ambulatory Visit: Payer: Self-pay

## 2020-06-06 VITALS — Ht 60.0 in | Wt 158.0 lb

## 2020-06-06 DIAGNOSIS — S46011A Strain of muscle(s) and tendon(s) of the rotator cuff of right shoulder, initial encounter: Secondary | ICD-10-CM | POA: Diagnosis not present

## 2020-06-06 DIAGNOSIS — G8929 Other chronic pain: Secondary | ICD-10-CM

## 2020-06-06 DIAGNOSIS — M25511 Pain in right shoulder: Secondary | ICD-10-CM | POA: Diagnosis not present

## 2020-06-06 DIAGNOSIS — M75121 Complete rotator cuff tear or rupture of right shoulder, not specified as traumatic: Secondary | ICD-10-CM | POA: Insufficient documentation

## 2020-06-06 NOTE — Progress Notes (Signed)
Office Visit Note   Patient: [name]           Date of Birth: 1951-01-31           MRN: [id number] Visit Date: 06/06/2020              Requested by: Caryl Bis, MD [place],  [place] [address] PCP: Caryl Bis, MD   Assessment & Plan: Visit Diagnoses:  1. Chronic right shoulder pain   2. Traumatic complete tear of right rotator cuff, initial encounter     Plan: [name] has been experiencing right shoulder pain for "a long time". She notes having a problem for probably 6 months but seems to be worse in the last 3 months. No history of injury or trauma. She is unable to raise her arm over her head. She has been followed by her primary care physician, Dr. Gar Ponto who obtained an MRI scan at [facility] this week. I have a copy of the report that demonstrates a complete full-thickness tear of the infraspinatus tendon with retraction to the level of the glenohumeral joint. There is a full-thickness retracted tear also involving the supraspinatus tendon with a small portion of the anteriormost tendon fibers remaining intact. The subscapularis has tendinosis with low-grade interstitial tear. Teres minor is intact. There is atrophy and fatty infiltration of the infraspinatus muscle. Preserved muscle bulk of the supraspinatus. There is moderate intra-articular biceps tendinosis and mild to moderate tenosynovial fluid within the biceps tendon sheath. Mild arthropathy of the [facility] joint. Small volume of fluid in the subacromial space. There is also mild diffuse chondral thinning and surface irregularity without discrete full-thickness cartilage defect at the glenohumeral joint. Slight superior subluxation of the humeral head relative to the glenoid. Labrum appears to be intact. No bone marrow edema.  Findings really had a very difficult time raising her arm overhead and is compromised as she is right-hand dominant.  I discussed the different treatment options including physical  therapy which she is already completed through her primary care physician and then consideration of rotator cuff tear versus reverse shoulder arthroplasty. My concern with the rotator cuff tear repair is her level of pain and significant atrophy of the infraspinatus muscle. I like to discuss the all the above with Dr. Marlou Sa to get his thoughts regarding whether or not she might be a good candidate for reverse shoulder arthroplasty and will call [name] back  Follow-Up Instructions: Return if symptoms worsen or fail to improve.   Orders:  Orders Placed This Encounter  Procedures  . XR Shoulder Right   No orders of the defined types were placed in this encounter.     Procedures: No procedures performed   Clinical Data: No additional findings.   Subjective: Chief Complaint  Patient presents with  . Right Shoulder - Pain  Patient presents today for her right shoulder pain. She said that it has been hurting for quite some time, but has really worsened in the last three months. No known injury. She states that most of her pain is located anteriorly. She has weakness and decreased range of motion. No numbness or tingling. She is right hand dominant. She takes tramadol twice daily for tingling and "stinging" in her lower extremities after knee replacement. She saw her PCP at [facility] and started physical therapy, but it did not help .She had an MRI performed on 06/03/2020.   HPI  Review of Systems  Constitutional: Negative for fatigue.  HENT: Negative for ear pain.   Eyes: Negative for pain.  Respiratory: Negative for shortness of breath.   Cardiovascular: Negative for leg swelling.  Gastrointestinal: Negative for constipation and diarrhea.  Endocrine: Negative for cold intolerance and heat intolerance.  Genitourinary: Negative for difficulty urinating.  Musculoskeletal: Negative for joint swelling.  Skin: Negative for rash.  Allergic/Immunologic: Negative for food  allergies.  Neurological: Positive for weakness.  Hematological: Does not bruise/bleed easily.  Psychiatric/Behavioral: Negative for sleep disturbance.     Objective: Vital Signs: Ht 5' (1.524 m)   Wt 158 lb (71.7 kg)   BMI 30.86 kg/m   Physical Exam Constitutional:      Appearance: She is well-developed.  Eyes:     Pupils: Pupils are equal, round, and reactive to light.  Pulmonary:     Effort: Pulmonary effort is normal.  Skin:    General: Skin is warm and dry.  Neurological:     Mental Status: She is alert and oriented to person, place, and time.  Psychiatric:        Behavior: Behavior normal.     Ortho Exam awake alert and oriented x3. Comfortable sitting. No obvious distress. Has very minimal motion of her right shoulder without pain. Passively I can abduct her about 20 degrees and flex about the same. She was quite uncomfortable at that point. She does have considerable weakness with internal and external rotation. She had less active motion than she did passively. No distal edema. Neurologically intact. Skin intact. No localized areas of tenderness to palpation with her arm at her side  Specialty Comments:  No specialty comments available.  Imaging: XR Shoulder Right  Result Date: 06/06/2020 Films of the right shoulder obtained in 2 projections. There is slight high riding of the humeral head and the glenoid but what appears to be a normal space between the humeral head and the acromion. Some mild degenerative changes at the [facility] joint. No ectopic calcification. No acute changes. No obvious narrowing of the glenohumeral joint    PMFS History: Patient Active Problem List   Diagnosis Date Noted  . Complete tear of right rotator cuff 06/06/2020  . S/P total knee replacement using cement, right 09/22/2016  . Unilateral primary osteoarthritis, right knee 09/09/2016  . Osteoarthritis of left knee 09/20/2013  . Obesity, unspecified 09/20/2013  . S/P total knee  replacement using cement 09/19/2013   Past Medical History:  Diagnosis Date  . Allergy    seasonal  . Arthritis   . Cancer ([place])    melanoma back  . Complication of anesthesia 2015   ? bp- passed out after in room. / after knee replacement  . Hypertension   . Numbness and tingling of foot    right foot  . Osteopenia   . PONV (postoperative nausea and vomiting)     Family History  Problem Relation Age of Onset  . Heart disease Mother   . Lung cancer Father   . Lung cancer Sister   . Heart failure Brother   . Esophageal cancer Paternal Grandfather   . Heart disease Sister   . Pancreatic cancer Paternal Grandmother     Past Surgical History:  Procedure Laterality Date  . APPENDECTOMY  80  . HAND SURGERY Right 06   foreign object  . KNEE ARTHROSCOPY Left 82,84   left knee  . KNEE ARTHROSCOPY Right 2010  . KNEE SURGERY Left 1983   ligament  . TOTAL KNEE ARTHROPLASTY Left 09/19/2013   Procedure: TOTAL KNEE  ARTHROPLASTY;  Surgeon: Garald Balding, MD;  Location: [facility];  Service: Orthopedics;  Laterality: Left;  . TOTAL KNEE ARTHROPLASTY Right 09/22/2016   Procedure: RIGHT TOTAL KNEE ARTHROPLASTY;  Surgeon: Garald Balding, MD;  Location: [facility];  Service: Orthopedics;  Laterality: Right;  . TUBAL LIGATION  75   Social History   Occupational History  . Not on file  Tobacco Use  . Smoking status: Former Smoker    Packs/day: 2.00    Years: 20.00    Pack years: 40.00    Types: Cigarettes    Quit date: 07/10/1993    Years since quitting: 26.9  . Smokeless tobacco: Never Used  Vaping Use  . Vaping Use: Never used  Substance and Sexual Activity  . Alcohol use: Yes    Comment: occ wine  . Drug use: No  . Sexual activity: Not on file

## 2020-06-07 NOTE — Addendum Note (Signed)
Addended by: Lendon Collar on: 06/07/2020 09:36 AM   Modules accepted: Orders

## 2020-06-12 ENCOUNTER — Encounter: Payer: Self-pay | Admitting: Orthopedic Surgery

## 2020-06-12 ENCOUNTER — Ambulatory Visit: Payer: Medicare HMO | Admitting: Orthopedic Surgery

## 2020-06-12 DIAGNOSIS — M25511 Pain in right shoulder: Secondary | ICD-10-CM | POA: Diagnosis not present

## 2020-06-12 DIAGNOSIS — G8929 Other chronic pain: Secondary | ICD-10-CM

## 2020-06-12 NOTE — Progress Notes (Signed)
Office Visit Note   Patient: [name]           Date of Birth: 07/19/1951           MRN: [id number] Visit Date: 06/12/2020 Requested by: Garald Balding, MD [address] [place],  [place] [address] PCP: Caryl Bis, MD  Subjective: Chief Complaint  Patient presents with  . Right Shoulder - Pain    HPI: [name] is a 69 year old patient with over 70-month history of right shoulder pain.  Reports decreased range of motion and pain.  She likes to garden.  Denies any history of injury in the right shoulder.  The pain wakes her from sleep.  She describes both weakness and pain in the right shoulder.  Denies any neck pain or numbness and tingling.  MRI scan is reviewed and she does have superior migration of the humeral head along with retracted supraspinatus tear and infraspinatus tear.  The stairs are retracted to the level of the humeral head and glenoid..              ROS: All systems reviewed are negative as they relate to the chief complaint within the history of present illness.  Patient denies  fevers or chills.   Assessment & Plan: Visit Diagnoses:  1. Chronic right shoulder pain     Plan: Impression is rotator cuff arthropathy with disability from pain and diminished function.  Plan is thin cut CT scan for patient specific instrumentation and planning followed by reverse shoulder replacement.  The risk and benefits are discussed with the patient including not limited to infection nerve vessel damage dislocation incomplete pain relief and incomplete restoration of function.  We also discussed lifetime 15 pound lifting limit.  I think this would be an operation which would improve on his quality of life.  Patient understands risk benefits and wishes to proceed.  All questions answered  Follow-Up Instructions: No follow-ups on file.   Orders:  Orders Placed This Encounter  Procedures  . CT SHOULDER RIGHT WO CONTRAST   No orders of the defined types were placed in this  encounter.     Procedures: No procedures performed   Clinical Data: No additional findings.  Objective: Vital Signs: There were no vitals taken for this visit.  Physical Exam:   Constitutional: Patient appears well-developed HEENT:  Head: Normocephalic Eyes:EOM are normal Neck: Normal range of motion Cardiovascular: Normal rate Pulmonary/chest: Effort normal Neurologic: Patient is alert Skin: Skin is warm Psychiatric: Patient has normal mood and affect    Ortho Exam: Ortho exam demonstrates palpable radial pulses bilaterally with good EPL FPL interosseous function along with wrist flexion extension bicep triceps and deltoid strength on the right-hand side.  Does have weakness to infraspinatus and supraspinatus testing along with diminished passive range of motion.  Forward flexion abduction external rotation all in the 30 degree range.  She is quite apprehensive about moving the shoulder.  The shoulder is located.  Specialty Comments:  No specialty comments available.  Imaging: No results found.   PMFS History: Patient Active Problem List   Diagnosis Date Noted  . Complete tear of right rotator cuff 06/06/2020  . S/P total knee replacement using cement, right 09/22/2016  . Unilateral primary osteoarthritis, right knee 09/09/2016  . Osteoarthritis of left knee 09/20/2013  . Obesity, unspecified 09/20/2013  . S/P total knee replacement using cement 09/19/2013   Past Medical History:  Diagnosis Date  . Allergy    seasonal  . Arthritis   .  Cancer ([place])    melanoma back  . Complication of anesthesia 2015   ? bp- passed out after in room. / after knee replacement  . Hypertension   . Numbness and tingling of foot    right foot  . Osteopenia   . PONV (postoperative nausea and vomiting)     Family History  Problem Relation Age of Onset  . Heart disease Mother   . Lung cancer Father   . Lung cancer Sister   . Heart failure Brother   . Esophageal cancer  Paternal Grandfather   . Heart disease Sister   . Pancreatic cancer Paternal Grandmother     Past Surgical History:  Procedure Laterality Date  . APPENDECTOMY  80  . HAND SURGERY Right 06   foreign object  . KNEE ARTHROSCOPY Left 82,84   left knee  . KNEE ARTHROSCOPY Right 2010  . KNEE SURGERY Left 1983   ligament  . TOTAL KNEE ARTHROPLASTY Left 09/19/2013   Procedure: TOTAL KNEE ARTHROPLASTY;  Surgeon: Garald Balding, MD;  Location: [facility];  Service: Orthopedics;  Laterality: Left;  . TOTAL KNEE ARTHROPLASTY Right 09/22/2016   Procedure: RIGHT TOTAL KNEE ARTHROPLASTY;  Surgeon: Garald Balding, MD;  Location: [facility];  Service: Orthopedics;  Laterality: Right;  . TUBAL LIGATION  75   Social History   Occupational History  . Not on file  Tobacco Use  . Smoking status: Former Smoker    Packs/day: 2.00    Years: 20.00    Pack years: 40.00    Types: Cigarettes    Quit date: 07/10/1993    Years since quitting: 26.9  . Smokeless tobacco: Never Used  Vaping Use  . Vaping Use: Never used  Substance and Sexual Activity  . Alcohol use: Yes    Comment: occ wine  . Drug use: No  . Sexual activity: Not on file

## 2020-06-17 DIAGNOSIS — Z23 Encounter for immunization: Secondary | ICD-10-CM | POA: Diagnosis not present

## 2020-06-26 DIAGNOSIS — Z23 Encounter for immunization: Secondary | ICD-10-CM | POA: Diagnosis not present

## 2020-06-28 ENCOUNTER — Other Ambulatory Visit: Payer: Medicare HMO

## 2020-07-03 ENCOUNTER — Telehealth: Payer: Self-pay

## 2020-07-03 NOTE — Telephone Encounter (Signed)
It looks like, per Dr. Rudene Anda note, that she completed this through her PCP prior to seeing him. Do I need to try and locate results?

## 2020-07-03 NOTE — Telephone Encounter (Signed)
precerted was started last week for this pts shoulder surgery for 08/20/20. They are questing if she has had any PT. Didn't see any mention of it or any notes so I just wanted to make sure before I call them back that she has not had any. Please advise.

## 2020-07-08 ENCOUNTER — Other Ambulatory Visit: Payer: Self-pay

## 2020-07-26 ENCOUNTER — Telehealth: Payer: Self-pay | Admitting: *Deleted

## 2020-07-26 NOTE — Telephone Encounter (Signed)
I called to check on the appeal with pt insurance and they stated an appeal was submitted and to resubmit the case. I spoke with Tameka with [employer] ID [id number] who gave me an appeal fax of [phone] with appeal # [id number] to submit with clinicals. We both went over why its  Needs to be authorized and reason for appeal. I should hear something by end of business day Mon no later than Tues.   I called pt and advised her of this and she was not happy, I told her I spoke with someone who helped me and hopefully get it authorized.

## 2020-07-29 ENCOUNTER — Telehealth: Payer: Self-pay

## 2020-07-29 NOTE — Telephone Encounter (Signed)
[name] from [place] called authorization has been approved. Josem Kaufmann #[id number]   Dates covering: 07/29/2020-01/26/2021

## 2020-07-30 NOTE — Telephone Encounter (Signed)
CALLED PT TO ADVISE HER OF APPROVAL AND FAXED ORDER TO [facility] TO CONTACT TO PT TO [facility]

## 2020-07-30 NOTE — Telephone Encounter (Signed)
See below

## 2020-08-08 ENCOUNTER — Other Ambulatory Visit: Payer: Self-pay

## 2020-08-20 ENCOUNTER — Ambulatory Visit
Admission: RE | Admit: 2020-08-20 | Discharge: 2020-08-20 | Disposition: A | Discharge status: Home / Self Care | Payer: Medicare HMO | Source: Ambulatory Visit | Attending: Orthopedic Surgery | Admitting: Orthopedic Surgery

## 2020-08-20 DIAGNOSIS — G8929 Other chronic pain: Secondary | ICD-10-CM

## 2020-08-20 DIAGNOSIS — M19011 Primary osteoarthritis, right shoulder: Secondary | ICD-10-CM | POA: Diagnosis not present

## 2020-08-20 DIAGNOSIS — M75121 Complete rotator cuff tear or rupture of right shoulder, not specified as traumatic: Secondary | ICD-10-CM | POA: Diagnosis not present

## 2020-08-20 DIAGNOSIS — M25511 Pain in right shoulder: Secondary | ICD-10-CM

## 2020-08-20 DIAGNOSIS — M6258 Muscle wasting and atrophy, not elsewhere classified, other site: Secondary | ICD-10-CM | POA: Diagnosis not present

## 2020-08-23 ENCOUNTER — Ambulatory Visit: Payer: Medicare HMO | Admitting: Orthopedic Surgery

## 2020-09-18 NOTE — Patient Instructions (Signed)
DUE TO COVID-19 ONLY ONE VISITOR IS ALLOWED TO COME WITH YOU AND STAY IN THE WAITING ROOM ONLY DURING PRE OP AND PROCEDURE DAY OF SURGERY. THE 1 VISITOR  MAY VISIT WITH YOU AFTER SURGERY IN YOUR PRIVATE ROOM DURING VISITING HOURS ONLY!  YOU NEED TO HAVE A COVID 19 TEST     1/14_______ @_______ , THIS TEST MUST BE DONE BEFORE SURGERY,  COVID TESTING SITE [address] [place] [address], IT IS ON THE RIGHT GOING OUT [address] APPROXIMATELY  2 MINUTES PAST [employer] ON THE RIGHT. ONCE YOUR COVID TEST IS COMPLETED,  PLEASE BEGIN THE QUARANTINE INSTRUCTIONS AS OUTLINED IN YOUR HANDOUT.                [name]   Your procedure is scheduled on: 09/24/20   Report to [facility] Main  Entrance   Report to admitting at AM     Call this number if you have problems the morning of surgery [place], NO CHEWING GUM [place].  No food after midnight.    You may have clear liquid until 4:30 AM.    At 4:30 AM drink pre surgery drink.   Nothing by mouth after 4:30 AM.    Take these medicines the morning of surgery with A SIP OF WATER: Metoprolol, Amlodipine                                You may not have any metal on your body including hair pins and              piercings  Do not wear jewelry, make-up, lotions, powders or perfumes, deodorant             Do not wear nail polish on your fingernails.  Do not shave  48 hours prior to surgery.                Do not bring valuables to the hospital. [place].  Contacts, dentures or bridgework may not be worn into surgery.      Patients discharged the day of surgery will not be allowed to drive home. IF YOU ARE HAVING SURGERY AND GOING HOME THE SAME DAY, YOU MUST HAVE AN ADULT TO DRIVE YOU HOME AND BE WITH YOU FOR 24 HOURS. YOU MAY GO HOME BY TAXI OR UBER OR ORTHERWISE, BUT AN ADULT MUST  ACCOMPANY YOU HOME AND STAY WITH YOU FOR 24 HOURS.  Name and phone number of your driver:  Special Instructions: N/A              Please read over the following fact sheets you were given: _____________________________________________________________________             [facility]- Preparing for Total Shoulder Arthroplasty    Before surgery, you can play an important role. Because skin is not sterile, your skin needs to be as free of germs as possible. You can reduce the number of germs on your skin by using the following products. . Benzoyl Peroxide Gel o Reduces the number of germs present on the skin o Applied twice a day to shoulder area starting two days before surgery    ==================================================================  Please follow these  instructions carefully:  BENZOYL PEROXIDE 5% GEL  Please do not use if you have an allergy to benzoyl peroxide.   If your skin becomes reddened/irritated stop using the benzoyl peroxide.  Starting two days before surgery, apply as follows: 1. Apply benzoyl peroxide in the morning and at night. Apply after taking a shower. If you are not taking a shower clean entire shoulder front, back, and side along with the armpit with a clean wet washcloth.  2. Place a quarter-sized dollop on your shoulder and rub in thoroughly, making sure to cover the front, back, and side of your shoulder, along with the armpit.   2 days before ____ AM   ____ PM              1 day before ____ AM   ____ PM                         3. Do this twice a day for two days.  (Last application is the night before surgery, AFTER using the CHG soap as described below).  4. Do NOT apply benzoyl peroxide gel on the day of surgery. [facility] - Preparing for Surgery Before surgery, you can play an important role.  Because skin is not sterile, your skin needs to be as free of germs as possible.  You can reduce the number of germs on your skin by washing with CHG  (chlorahexidine gluconate) soap before surgery.  CHG is an antiseptic cleaner which kills germs and bonds with the skin to continue killing germs even after washing. Please DO NOT use if you have an allergy to CHG or antibacterial soaps.  If your skin becomes reddened/irritated stop using the CHG and inform your nurse when you arrive at Short Stay. Do not shave (including legs and underarms) for at least 48 hours prior to the first CHG shower.  You may shave your face/neck. Please follow these instructions carefully:  1.  Shower with CHG Soap the night before surgery and the  morning of Surgery.  2.  If you choose to wash your hair, wash your hair first as usual with your  normal  shampoo.  3.  After you shampoo, rinse your hair and body thoroughly to remove the  shampoo.                                        4.  Use CHG as you would any other liquid soap.  You can apply chg directly  to the skin and wash                       Gently with a scrungie or clean washcloth.  5.  Apply the CHG Soap to your body ONLY FROM THE NECK DOWN.   Do not use on face/ open                           Wound or open sores. Avoid contact with eyes, ears mouth and genitals (private parts).                       Wash face,  Genitals (private parts) with your normal soap.             6.  Wash thoroughly, paying special attention  to the area where your surgery  will be performed.  7.  Thoroughly rinse your body with warm water from the neck down.  8.  DO NOT shower/wash with your normal soap after using and rinsing off  the CHG Soap.                9.  Pat yourself dry with a clean towel.            10.  Wear clean pajamas.            11.  Place clean sheets on your bed the night of your first shower and do not  sleep with pets. Day of Surgery : Do not apply any lotions/deodorants the morning of surgery.  Please wear clean clothes to the hospital/surgery center.  FAILURE TO FOLLOW THESE INSTRUCTIONS MAY RESULT IN THE  CANCELLATION OF YOUR SURGERY PATIENT SIGNATURE_________________________________  NURSE SIGNATURE__________________________________  ________________________________________________________________________

## 2020-09-20 ENCOUNTER — Inpatient Hospital Stay (HOSPITAL_COMMUNITY)
Admission: RE | Admit: 2020-09-20 | Discharge: 2020-09-20 | Disposition: A | Discharge status: Home / Self Care | Payer: Medicare HMO | Source: Ambulatory Visit

## 2020-09-20 ENCOUNTER — Other Ambulatory Visit (HOSPITAL_COMMUNITY): Payer: Medicare HMO

## 2020-09-20 ENCOUNTER — Encounter (HOSPITAL_COMMUNITY): Payer: Self-pay

## 2020-09-24 ENCOUNTER — Ambulatory Visit: Admission: RE | Admit: 2020-09-24 | Payer: Medicare HMO | Source: Home / Self Care | Admitting: Orthopedic Surgery

## 2020-09-24 ENCOUNTER — Encounter: Admission: RE | Payer: Self-pay | Source: Home / Self Care

## 2020-09-24 SURGERY — ARTHROPLASTY, SHOULDER, TOTAL, REVERSE
Anesthesia: General | Site: Shoulder | Laterality: Right

## 2020-10-04 ENCOUNTER — Other Ambulatory Visit: Payer: Self-pay

## 2020-10-04 DIAGNOSIS — I10 Essential (primary) hypertension: Secondary | ICD-10-CM | POA: Diagnosis not present

## 2020-10-04 DIAGNOSIS — E7849 Other hyperlipidemia: Secondary | ICD-10-CM | POA: Diagnosis not present

## 2020-10-04 DIAGNOSIS — E782 Mixed hyperlipidemia: Secondary | ICD-10-CM | POA: Diagnosis not present

## 2020-10-04 DIAGNOSIS — N183 Chronic kidney disease, stage 3 unspecified: Secondary | ICD-10-CM | POA: Diagnosis not present

## 2020-10-06 ENCOUNTER — Other Ambulatory Visit: Payer: Self-pay | Admitting: Surgical

## 2020-10-06 MED ORDER — ACETAMINOPHEN 325 MG PO TABS: 975.0000 mg | ORAL_TABLET | Freq: Once | ORAL | 0 refills | Status: AC

## 2020-10-06 MED ORDER — CELECOXIB 200 MG PO CAPS: 400.0000 mg | ORAL_CAPSULE | Freq: Once | ORAL | 0 refills | Status: AC

## 2020-10-06 MED ORDER — TRAMADOL HCL 50 MG PO TABS: 50.0000 mg | ORAL_TABLET | Freq: Once | ORAL | 0 refills | Status: AC

## 2020-10-06 MED ORDER — GABAPENTIN 300 MG PO CAPS: 300.0000 mg | ORAL_CAPSULE | Freq: Once | ORAL | 0 refills | Status: DC

## 2020-10-07 ENCOUNTER — Other Ambulatory Visit: Payer: Self-pay

## 2020-10-07 ENCOUNTER — Other Ambulatory Visit (HOSPITAL_COMMUNITY): Payer: Medicare HMO

## 2020-10-07 ENCOUNTER — Other Ambulatory Visit (HOSPITAL_COMMUNITY)
Admission: RE | Admit: 2020-10-07 | Discharge: 2020-10-07 | Disposition: A | Discharge status: Home / Self Care | Payer: Medicare HMO | Source: Ambulatory Visit | Attending: Orthopedic Surgery | Admitting: Orthopedic Surgery

## 2020-10-07 ENCOUNTER — Encounter (HOSPITAL_COMMUNITY)
Admission: RE | Admit: 2020-10-07 | Discharge: 2020-10-07 | Disposition: A | Discharge status: Home / Self Care | Payer: Medicare HMO | Source: Ambulatory Visit | Attending: Orthopedic Surgery | Admitting: Orthopedic Surgery

## 2020-10-07 ENCOUNTER — Encounter (HOSPITAL_COMMUNITY): Payer: Self-pay

## 2020-10-07 DIAGNOSIS — Z01818 Encounter for other preprocedural examination: Secondary | ICD-10-CM | POA: Diagnosis not present

## 2020-10-07 DIAGNOSIS — Z20822 Contact with and (suspected) exposure to covid-19: Secondary | ICD-10-CM | POA: Insufficient documentation

## 2020-10-07 DIAGNOSIS — Z01812 Encounter for preprocedural laboratory examination: Secondary | ICD-10-CM | POA: Insufficient documentation

## 2020-10-07 LAB — BASIC METABOLIC PANEL
Anion gap: 10 (ref 5–15)
BUN: 25 mg/dL — ABNORMAL HIGH (ref 8–23)
CO2: 23 mmol/L (ref 22–32)
Calcium: 9.2 mg/dL (ref 8.9–10.3)
Chloride: 101 mmol/L (ref 98–111)
Creatinine, Ser: 1.1 mg/dL — ABNORMAL HIGH (ref 0.44–1.00)
GFR, Estimated: 54 mL/min — ABNORMAL LOW (ref >60)
Glucose, Bld: 92 mg/dL (ref 70–99)
Potassium: 4.1 mmol/L (ref 3.5–5.1)
Sodium: 134 mmol/L — ABNORMAL LOW (ref 135–145)

## 2020-10-07 LAB — URINALYSIS, ROUTINE W REFLEX MICROSCOPIC
Bilirubin Urine: NEGATIVE
Glucose, UA: NEGATIVE mg/dL
Ketones, ur: NEGATIVE mg/dL
Nitrite: NEGATIVE
Protein, ur: NEGATIVE mg/dL
Specific Gravity, Urine: 1.018 (ref 1.005–1.030)
pH: 5 (ref 5.0–8.0)

## 2020-10-07 LAB — CBC
HCT: 36.2 % (ref 36.0–46.0)
Hemoglobin: 12.8 g/dL (ref 12.0–15.0)
MCH: 33 pg (ref 26.0–34.0)
MCHC: 35.4 g/dL (ref 30.0–36.0)
MCV: 93.3 fL (ref 80.0–100.0)
Platelets: 276 10*3/uL (ref 150–400)
RBC: 3.88 MIL/uL (ref 3.87–5.11)
RDW: 12.3 % (ref 11.5–15.5)
WBC: 8.1 10*3/uL (ref 4.0–10.5)
nRBC: 0 % (ref 0.0–0.2)

## 2020-10-07 LAB — SURGICAL PCR SCREEN
MRSA, PCR: NEGATIVE
Staphylococcus aureus: NEGATIVE

## 2020-10-07 LAB — SARS CORONAVIRUS 2 (TAT 6-24 HRS): SARS Coronavirus 2: NEGATIVE

## 2020-10-07 NOTE — Progress Notes (Signed)
Staff message sent to dr. Marlou Sa regarding urine sample results.

## 2020-10-07 NOTE — Progress Notes (Signed)
PCP - Dr. Gar Ponto  Cardiologist - Denies  Chest x-ray - Not indicated EKG - 10/07/20 Stress Test - 2018 when having knee surgery d/t B/P being high ECHO - Dr. Nadyne Coombes 2018, no follow up needed Cardiac Cath - Denies  Sleep Study - No [facility]  DM - denies  ERAS Protcol -Yes PRE-SURGERY Ensure    COVID TEST- 10/07/20  Anesthesia review: No  Patient denies shortness of breath, fever, cough and chest pain at PAT appointment   All instructions explained to the patient, with a verbal understanding of the material. Patient agrees to go over the instructions while at home for a better understanding. Patient also instructed to self quarantine after being tested for COVID-19. The opportunity to ask questions was provided.

## 2020-10-07 NOTE — Progress Notes (Signed)
[id number] [place], [place] [address] [place] [address] Phone: [phone] Fax: [phone]      Your procedure is scheduled on Thursday February 3rd.  Report to [facility] Main Entrance "A" at 10:15 A.M., and check in at the Admitting office.  Call this number if you have problems the morning of surgery:  [phone]  Call [phone] if you have any questions prior to your surgery date Monday-Friday 8am-4pm    Remember:  Do not eat after midnight the night before your surgery  You may drink clear liquids until 9:15am the morning of your surgery.   Clear liquids allowed are: Water, Non-Citrus Juices (without pulp), Carbonated Beverages, Clear Tea, Black Coffee Only, and Gatorade   Enhanced Recovery after Surgery for Orthopedics Enhanced Recovery after Surgery is a protocol used to improve the stress on your body and your recovery after surgery.  Patient Instructions  . The night before surgery:  o No food after midnight. ONLY clear liquids after midnight  .  Marland Kitchen The day of surgery (if you do NOT have diabetes):  o Drink ONE (1) Pre-Surgery Clear Ensure by __9:15___ am the morning of surgery   o This drink was given to you during your hospital  pre-op appointment visit. o Nothing else to drink after completing the  Pre-Surgery Clear Ensure.         If you have questions, please contact your surgeon's office.      Take these medicines the morning of surgery with A SIP OF WATER   amLODipine (NORVASC) 5 MG tablet   metoprolol tartrate (LOPRESSOR) 25 MG tablet IF NEEDED  diphenhydrAMINE (BENADRYL) 25 MG tablet  As of today, STOP taking any Aspirin (unless otherwise instructed by your surgeon) Aleve, Naproxen, Ibuprofen, Motrin, Advil, Goody's, BC's, all herbal medications, fish oil, and all vitamins.                      Do not wear jewelry, make up, or nail polish            Do not wear lotions,  powders, perfumes, or deodorant.            Do not shave 48 hours prior to surgery.             Do not bring valuables to the hospital.            [facility] is not responsible for any belongings or valuables.  Do NOT Smoke (Tobacco/Vaping) or drink Alcohol 24 hours prior to your procedure If you use a CPAP at night, you may bring all equipment for your overnight stay.   Contacts, glasses, dentures or bridgework may not be worn into surgery.      For patients admitted to the hospital, discharge time will be determined by your treatment team.   Patients discharged the day of surgery will not be allowed to drive home, and someone needs to stay with them for 24 hours.  [facility]- Preparing for Shoulder Surgery  ?  Before surgery, you can play an important role. Because skin is not sterile, your skin needs to be as free of germs as possible. You can reduce the number of germs on your skin by using the following products.   1). Benzoyl Peroxide Gel: reduces the number of germs present on the skin   *Applied twice a day to shoulder area starting two days  before surgery     2). Chlorhexidine Gluconate (CHG) Soap: An antiseptic cleaner that kills germs and bonds with the skin to continue killing germs even after washing   *Used for showering the night before surgery and morning of surgery   ?    Please follow these instructions carefully:     1). BENZOYL PEROXIDE 5% GEL (Please do not use if you have an allergy to benzoyl peroxide.   If your skin becomes reddened/irritated stop using the benzoyl peroxide)     Starting TWO DAYS BEFORE surgery:    Apply benzoyl peroxide in the morning and at night. Apply after taking a shower. If you are not taking a shower clean entire shoulder front, back, and side along with the armpit with a clean wet washcloth.     Place a quarter-sized amount of gel on your shoulder and rub in thoroughly, making sure to cover the front, back, and side of your  shoulder, along with the armpit.                           Do this twice a day for two days.  (Last application is the night before surgery, AFTER using the CHG soap as described below).   Do NOT apply benzoyl peroxide gel on the day of surgery.   2 days before ____ AM   ____ PM              1 day before ____ AM   ____ PM    2) CHG Soap: Please do not use if you have an allergy to CHG or antibacterial soaps. If your skin becomes reddened/irritated stop using the CHG.  Do not shave (including legs and underarms) for at least 48 hours prior to first CHG shower. It is OK to shave your face.  Please follow these instructions carefully.   1. Shower the NIGHT BEFORE SURGERY (before applying benzoyl peroxide gel) and the MORNING OF SURGERY with CHG Soap.   2. If you chose to wash your hair, wash your hair first as usual with your normal shampoo.  3. After you shampoo, rinse your hair and body thoroughly to remove the shampoo.  4. Use CHG as you would any other liquid soap. You can apply CHG directly to the skin and wash gently with a scrungie or a clean washcloth.   5. Apply the CHG Soap to your body ONLY FROM THE NECK DOWN.  Do not use on open wounds or open sores. Avoid contact with your eyes, ears, mouth and genitals (private parts). Wash Face and genitals (private parts) with your normal soap.   6. Wash thoroughly, paying special attention to the area where your surgery will be performed.  7. Thoroughly rinse your body with warm water from the neck down.  8. DO NOT shower/wash with your normal soap after using and rinsing off the CHG Soap.  9. Pat yourself dry with a CLEAN TOWEL.  10. Wear CLEAN PAJAMAS to bed the night before surgery  11. Place CLEAN SHEETS on your bed the night of your first shower and DO NOT SLEEP WITH PETS.  Oral Hygiene is also important to reduce your risk of infection.  Remember - BRUSH YOUR TEETH THE MORNING OF SURGERY WITH YOUR REGULAR TOOTHPASTE  Day of  Surgery: Wear Clean/Comfortable clothing the morning of surgery Do not apply any deodorants/lotions.   Remember to brush your teeth WITH YOUR REGULAR TOOTHPASTE.  Please read over the following fact sheets that you were given.

## 2020-10-08 DIAGNOSIS — E7849 Other hyperlipidemia: Secondary | ICD-10-CM | POA: Diagnosis not present

## 2020-10-08 DIAGNOSIS — M19011 Primary osteoarthritis, right shoulder: Secondary | ICD-10-CM | POA: Diagnosis not present

## 2020-10-08 DIAGNOSIS — M1711 Unilateral primary osteoarthritis, right knee: Secondary | ICD-10-CM | POA: Diagnosis not present

## 2020-10-08 DIAGNOSIS — N1832 Chronic kidney disease, stage 3b: Secondary | ICD-10-CM | POA: Diagnosis not present

## 2020-10-08 DIAGNOSIS — Z7189 Other specified counseling: Secondary | ICD-10-CM | POA: Diagnosis not present

## 2020-10-08 DIAGNOSIS — G6289 Other specified polyneuropathies: Secondary | ICD-10-CM | POA: Diagnosis not present

## 2020-10-08 DIAGNOSIS — I10 Essential (primary) hypertension: Secondary | ICD-10-CM | POA: Diagnosis not present

## 2020-10-08 LAB — URINE CULTURE

## 2020-10-09 ENCOUNTER — Inpatient Hospital Stay: Payer: Medicare HMO | Admitting: Orthopedic Surgery

## 2020-10-10 ENCOUNTER — Other Ambulatory Visit: Payer: Self-pay

## 2020-10-10 ENCOUNTER — Encounter (HOSPITAL_COMMUNITY): Payer: Self-pay | Admitting: Orthopedic Surgery

## 2020-10-10 ENCOUNTER — Encounter (HOSPITAL_COMMUNITY)
Admission: RE | Disposition: A | Discharge status: Home / Self Care | Payer: Self-pay | Source: Home / Self Care | Attending: Orthopedic Surgery

## 2020-10-10 ENCOUNTER — Ambulatory Visit (HOSPITAL_COMMUNITY)
Admission: RE | Admit: 2020-10-10 | Discharge: 2020-10-10 | Disposition: A | Discharge status: Home / Self Care | Payer: Medicare HMO | Attending: Orthopedic Surgery | Admitting: Orthopedic Surgery

## 2020-10-10 ENCOUNTER — Ambulatory Visit (HOSPITAL_COMMUNITY): Payer: Medicare HMO | Admitting: Certified Registered Nurse Anesthetist

## 2020-10-10 DIAGNOSIS — Z79891 Long term (current) use of opiate analgesic: Secondary | ICD-10-CM | POA: Insufficient documentation

## 2020-10-10 DIAGNOSIS — M25511 Pain in right shoulder: Secondary | ICD-10-CM | POA: Diagnosis not present

## 2020-10-10 DIAGNOSIS — Z85820 Personal history of malignant melanoma of skin: Secondary | ICD-10-CM | POA: Insufficient documentation

## 2020-10-10 DIAGNOSIS — G8918 Other acute postprocedural pain: Secondary | ICD-10-CM | POA: Diagnosis not present

## 2020-10-10 DIAGNOSIS — Z96653 Presence of artificial knee joint, bilateral: Secondary | ICD-10-CM | POA: Insufficient documentation

## 2020-10-10 DIAGNOSIS — Z79899 Other long term (current) drug therapy: Secondary | ICD-10-CM | POA: Diagnosis not present

## 2020-10-10 DIAGNOSIS — Z87891 Personal history of nicotine dependence: Secondary | ICD-10-CM | POA: Insufficient documentation

## 2020-10-10 DIAGNOSIS — M129 Arthropathy, unspecified: Secondary | ICD-10-CM | POA: Insufficient documentation

## 2020-10-10 DIAGNOSIS — M19011 Primary osteoarthritis, right shoulder: Secondary | ICD-10-CM

## 2020-10-10 DIAGNOSIS — I10 Essential (primary) hypertension: Secondary | ICD-10-CM | POA: Diagnosis not present

## 2020-10-10 DIAGNOSIS — M12811 Other specific arthropathies, not elsewhere classified, right shoulder: Secondary | ICD-10-CM | POA: Diagnosis not present

## 2020-10-10 HISTORY — PX: REVERSE SHOULDER ARTHROPLASTY: SHX5054

## 2020-10-10 SURGERY — ARTHROPLASTY, SHOULDER, TOTAL, REVERSE
Anesthesia: General | Site: Shoulder | Laterality: Right

## 2020-10-10 MED ORDER — PROPOFOL 10 MG/ML IV BOLUS
INTRAVENOUS | Status: AC
  Filled 2020-10-10: qty 40

## 2020-10-10 MED ORDER — POVIDONE-IODINE 10 % EX SWAB
2.0000 "application " | Freq: Once | CUTANEOUS | Status: AC
  Administered 2020-10-10: 2 via TOPICAL

## 2020-10-10 MED ORDER — ROCURONIUM BROMIDE 10 MG/ML (PF) SYRINGE
PREFILLED_SYRINGE | INTRAVENOUS | Status: AC
  Filled 2020-10-10: qty 10

## 2020-10-10 MED ORDER — PHENYLEPHRINE HCL-NACL 10-0.9 MG/250ML-% IV SOLN
INTRAVENOUS | Status: DC | PRN
  Administered 2020-10-10: 20 ug/min via INTRAVENOUS

## 2020-10-10 MED ORDER — EPHEDRINE 5 MG/ML INJ
INTRAVENOUS | Status: AC
  Filled 2020-10-10: qty 10

## 2020-10-10 MED ORDER — POVIDONE-IODINE 7.5 % EX SOLN
Freq: Once | CUTANEOUS | Status: DC
  Filled 2020-10-10: qty 118

## 2020-10-10 MED ORDER — 0.9 % SODIUM CHLORIDE (POUR BTL) OPTIME
TOPICAL | Status: DC | PRN
  Administered 2020-10-10 (×5): 1000 mL

## 2020-10-10 MED ORDER — FENTANYL CITRATE (PF) 250 MCG/5ML IJ SOLN
INTRAMUSCULAR | Status: AC
  Filled 2020-10-10: qty 5

## 2020-10-10 MED ORDER — VANCOMYCIN HCL 1000 MG IV SOLR
INTRAVENOUS | Status: AC
  Filled 2020-10-10: qty 1000

## 2020-10-10 MED ORDER — MEPERIDINE HCL 25 MG/ML IJ SOLN: 6.2500 mg | INTRAMUSCULAR | Status: DC | PRN

## 2020-10-10 MED ORDER — AMISULPRIDE (ANTIEMETIC) 5 MG/2ML IV SOLN: 10.0000 mg | Freq: Once | INTRAVENOUS | Status: DC | PRN

## 2020-10-10 MED ORDER — ROCURONIUM BROMIDE 10 MG/ML (PF) SYRINGE
PREFILLED_SYRINGE | INTRAVENOUS | Status: DC | PRN
  Administered 2020-10-10: 50 mg via INTRAVENOUS

## 2020-10-10 MED ORDER — GLYCOPYRROLATE PF 0.2 MG/ML IJ SOSY
PREFILLED_SYRINGE | INTRAMUSCULAR | Status: DC | PRN
  Administered 2020-10-10 (×2): .1 mg via INTRAVENOUS

## 2020-10-10 MED ORDER — ONDANSETRON HCL 4 MG/2ML IJ SOLN
INTRAMUSCULAR | Status: AC
  Filled 2020-10-10: qty 2

## 2020-10-10 MED ORDER — LIDOCAINE 2% (20 MG/ML) 5 ML SYRINGE
INTRAMUSCULAR | Status: DC | PRN
  Administered 2020-10-10: 100 mg via INTRAVENOUS

## 2020-10-10 MED ORDER — ONDANSETRON HCL 4 MG PO TABS: 4.0000 mg | ORAL_TABLET | Freq: Three times a day (TID) | ORAL | 0 refills | Status: AC | PRN

## 2020-10-10 MED ORDER — DEXAMETHASONE SODIUM PHOSPHATE 10 MG/ML IJ SOLN
8.0000 mg | Freq: Once | INTRAMUSCULAR | Status: AC
  Administered 2020-10-10: 8 mg via INTRAVENOUS
  Filled 2020-10-10: qty 1

## 2020-10-10 MED ORDER — FENTANYL CITRATE (PF) 100 MCG/2ML IJ SOLN: 25.0000 ug | INTRAMUSCULAR | Status: DC | PRN

## 2020-10-10 MED ORDER — FENTANYL CITRATE (PF) 100 MCG/2ML IJ SOLN
INTRAMUSCULAR | Status: AC
  Administered 2020-10-10: 100 ug
  Filled 2020-10-10: qty 2

## 2020-10-10 MED ORDER — CHLORHEXIDINE GLUCONATE 0.12 % MT SOLN
15.0000 mL | Freq: Once | OROMUCOSAL | Status: AC
  Administered 2020-10-10: 15 mL via OROMUCOSAL
  Filled 2020-10-10: qty 15

## 2020-10-10 MED ORDER — VANCOMYCIN HCL 1000 MG IV SOLR
INTRAVENOUS | Status: DC | PRN
  Administered 2020-10-10: 1000 mg

## 2020-10-10 MED ORDER — LACTATED RINGERS IV BOLUS: 500.0000 mL | Freq: Once | INTRAVENOUS | Status: DC

## 2020-10-10 MED ORDER — OXYCODONE-ACETAMINOPHEN 5-325 MG PO TABS: 1.0000 | ORAL_TABLET | ORAL | 0 refills | Status: AC | PRN

## 2020-10-10 MED ORDER — PROPOFOL 10 MG/ML IV BOLUS
INTRAVENOUS | Status: DC | PRN
  Administered 2020-10-10: 120 mg via INTRAVENOUS

## 2020-10-10 MED ORDER — TRANEXAMIC ACID-NACL 1000-0.7 MG/100ML-% IV SOLN
1000.0000 mg | INTRAVENOUS | Status: AC
  Administered 2020-10-10: 1000 mg via INTRAVENOUS
  Filled 2020-10-10: qty 100

## 2020-10-10 MED ORDER — CEFAZOLIN SODIUM-DEXTROSE 2-4 GM/100ML-% IV SOLN
2.0000 g | INTRAVENOUS | Status: AC
  Administered 2020-10-10: 2 g via INTRAVENOUS
  Filled 2020-10-10: qty 100

## 2020-10-10 MED ORDER — GLYCOPYRROLATE PF 0.2 MG/ML IJ SOSY
PREFILLED_SYRINGE | INTRAMUSCULAR | Status: AC
  Filled 2020-10-10: qty 1

## 2020-10-10 MED ORDER — BUPIVACAINE LIPOSOME 1.3 % IJ SUSP
INTRAMUSCULAR | Status: DC | PRN
  Administered 2020-10-10: 10 mL via PERINEURAL

## 2020-10-10 MED ORDER — BUPIVACAINE HCL (PF) 0.5 % IJ SOLN
INTRAMUSCULAR | Status: DC | PRN
  Administered 2020-10-10: 10 mL via PERINEURAL

## 2020-10-10 MED ORDER — SCOPOLAMINE 1 MG/3DAYS TD PT72
1.0000 | MEDICATED_PATCH | TRANSDERMAL | Status: DC
  Administered 2020-10-10: 1.5 mg via TRANSDERMAL
  Filled 2020-10-10: qty 1

## 2020-10-10 MED ORDER — LACTATED RINGERS IV SOLN: INTRAVENOUS | Status: DC

## 2020-10-10 MED ORDER — SCOPOLAMINE 1 MG/3DAYS TD PT72
MEDICATED_PATCH | TRANSDERMAL | Status: AC
  Filled 2020-10-10: qty 1

## 2020-10-10 MED ORDER — LIDOCAINE 2% (20 MG/ML) 5 ML SYRINGE
INTRAMUSCULAR | Status: AC
  Filled 2020-10-10: qty 5

## 2020-10-10 MED ORDER — LACTATED RINGERS IV BOLUS: 250.0000 mL | Freq: Once | INTRAVENOUS | Status: DC

## 2020-10-10 MED ORDER — CLINDAMYCIN HCL 300 MG PO CAPS: 300.0000 mg | ORAL_CAPSULE | Freq: Once | ORAL | 0 refills | Status: AC

## 2020-10-10 MED ORDER — PROPOFOL 500 MG/50ML IV EMUL
INTRAVENOUS | Status: DC | PRN
Start: 2020-10-10 — End: 2020-10-10
  Administered 2020-10-10: 25 ug/kg/min via INTRAVENOUS

## 2020-10-10 MED ORDER — APREPITANT 40 MG PO CAPS
40.0000 mg | ORAL_CAPSULE | Freq: Once | ORAL | Status: AC
  Administered 2020-10-10: 40 mg via ORAL
  Filled 2020-10-10: qty 1

## 2020-10-10 MED ORDER — SUGAMMADEX SODIUM 200 MG/2ML IV SOLN
INTRAVENOUS | Status: DC | PRN
  Administered 2020-10-10: 200 mg via INTRAVENOUS

## 2020-10-10 MED ORDER — ORAL CARE MOUTH RINSE: 15.0000 mL | Freq: Once | OROMUCOSAL | Status: AC

## 2020-10-10 MED ORDER — METHOCARBAMOL 500 MG PO TABS: 500.0000 mg | ORAL_TABLET | Freq: Three times a day (TID) | ORAL | 0 refills | Status: AC | PRN

## 2020-10-10 MED ORDER — ONDANSETRON HCL 4 MG/2ML IJ SOLN
INTRAMUSCULAR | Status: DC | PRN
  Administered 2020-10-10: 4 mg via INTRAVENOUS

## 2020-10-10 MED ORDER — IRRISEPT - 450ML BOTTLE WITH 0.05% CHG IN STERILE WATER, USP 99.95% OPTIME
TOPICAL | Status: DC | PRN
  Administered 2020-10-10: 450 mL via TOPICAL

## 2020-10-10 MED ORDER — EPHEDRINE SULFATE-NACL 50-0.9 MG/10ML-% IV SOSY
PREFILLED_SYRINGE | INTRAVENOUS | Status: DC | PRN
  Administered 2020-10-10: 10 mg via INTRAVENOUS
  Administered 2020-10-10: 5 mg via INTRAVENOUS

## 2020-10-10 SURGICAL SUPPLY — 81 items
AID PSTN UNV HD RSTRNT DISP (MISCELLANEOUS) ×1
ALCOHOL 70% 16 OZ (MISCELLANEOUS) ×2 IMPLANT
APL PRP STRL LF DISP 70% ISPRP (MISCELLANEOUS) ×1
AUG COMP REV MI TAPER ADAPTER (Joint) ×2 IMPLANT
AUGMENT COMP REV MI TAPR ADPTR (Joint) IMPLANT
BEARING HUMERAL SHLDER 36M STD (Shoulder) IMPLANT
BIT DRILL 2.7 W/STOP DISP (BIT) ×1 IMPLANT
BIT DRILL QUICK REL 1/8 2PK SL (DRILL) IMPLANT
BIT DRILL TWIST 2.7 (BIT) ×1 IMPLANT
BLADE SAW SGTL 13X75X1.27 (BLADE) ×2 IMPLANT
BRNG HUM STD 36 RVRS SHLDR (Shoulder) ×1 IMPLANT
BSPLAT GLND SM AUG TPR ADPR (Joint) ×1 IMPLANT
CHLORAPREP W/TINT 26 (MISCELLANEOUS) ×2 IMPLANT
CLOSURE STERI-STRIP 1/4X4 (GAUZE/BANDAGES/DRESSINGS) ×1 IMPLANT
COOLER ICEMAN CLASSIC (MISCELLANEOUS) ×2 IMPLANT
COVER SURGICAL LIGHT HANDLE (MISCELLANEOUS) ×2 IMPLANT
COVER WAND RF STERILE (DRAPES) ×2 IMPLANT
DRAPE INCISE IOBAN 66X45 STRL (DRAPES) ×2 IMPLANT
DRAPE U-SHAPE 47X51 STRL (DRAPES) ×4 IMPLANT
DRILL QUICK RELEASE 1/8 INCH (DRILL) ×2
DRSG AQUACEL AG ADV 3.5X10 (GAUZE/BANDAGES/DRESSINGS) ×2 IMPLANT
ELECT BLADE 4.0 EZ CLEAN MEGAD (MISCELLANEOUS) ×2
ELECT CAUTERY BLADE 6.4 (BLADE) ×1 IMPLANT
ELECT REM PT RETURN 9FT ADLT (ELECTROSURGICAL) ×2
ELECTRODE BLDE 4.0 EZ CLN MEGD (MISCELLANEOUS) ×1 IMPLANT
ELECTRODE REM PT RTRN 9FT ADLT (ELECTROSURGICAL) ×1 IMPLANT
GAUZE SPONGE 4X4 12PLY STRL LF (GAUZE/BANDAGES/DRESSINGS) ×2 IMPLANT
GLENOID SPHERE 36MM CVD +3 (Orthopedic Implant) ×1 IMPLANT
GLOVE BIOGEL PI IND STRL 7.0 (GLOVE) ×1 IMPLANT
GLOVE BIOGEL PI INDICATOR 7.0 (GLOVE) ×1
GLOVE ECLIPSE 7.0 STRL STRAW (GLOVE) ×2 IMPLANT
GLOVE ECLIPSE 8.0 STRL XLNG CF (GLOVE) ×2 IMPLANT
GLOVE SRG 8 PF TXTR STRL LF DI (GLOVE) ×1 IMPLANT
GLOVE SURG UNDER POLY LF SZ8 (GLOVE) ×2
GOWN STRL REUS W/ TWL LRG LVL3 (GOWN DISPOSABLE) ×2 IMPLANT
GOWN STRL REUS W/TWL LRG LVL3 (GOWN DISPOSABLE) ×4
GUIDE MODEL REV SHLD RT (ORTHOPEDIC DISPOSABLE SUPPLIES) ×1 IMPLANT
HYDROGEN PEROXIDE 16OZ (MISCELLANEOUS) ×2 IMPLANT
JET LAVAGE IRRISEPT WOUND (IRRIGATION / IRRIGATOR) ×2
KIT BASIN OR (CUSTOM PROCEDURE TRAY) ×2 IMPLANT
KIT TURNOVER KIT B (KITS) ×2 IMPLANT
LAVAGE JET IRRISEPT WOUND (IRRIGATION / IRRIGATOR) ×1 IMPLANT
LOOP VESSEL MAXI BLUE (MISCELLANEOUS) ×2 IMPLANT
MANIFOLD NEPTUNE II (INSTRUMENTS) ×2 IMPLANT
NDL TAPERED W/ NITINOL LOOP (MISCELLANEOUS) ×1 IMPLANT
NEEDLE TAPERED W/ NITINOL LOOP (MISCELLANEOUS) ×2 IMPLANT
NS IRRIG 1000ML POUR BTL (IV SOLUTION) ×2 IMPLANT
PACK SHOULDER (CUSTOM PROCEDURE TRAY) ×2 IMPLANT
PAD ARMBOARD 7.5X6 YLW CONV (MISCELLANEOUS) ×4 IMPLANT
PAD COLD SHLDR WRAP-ON (PAD) ×2 IMPLANT
PASSER SUT SWANSON 36MM LOOP (INSTRUMENTS) ×2 IMPLANT
PIN HUMERAL STMN 3.2MMX9IN (INSTRUMENTS) ×2 IMPLANT
REAMER GUIDE BUSHING SURG DISP (MISCELLANEOUS) ×1 IMPLANT
REAMER GUIDE W/SCREW AUG (MISCELLANEOUS) ×1 IMPLANT
RESTRAINT HEAD UNIVERSAL NS (MISCELLANEOUS) ×2 IMPLANT
RETRIEVER SUT HEWSON (MISCELLANEOUS) ×1 IMPLANT
SCREW BONE STRL 6.5MMX25MM (Screw) ×1 IMPLANT
SCREW LOCKING 4.75MMX15MM (Screw) ×2 IMPLANT
SCREW LOCKING NS 4.75MMX20MM (Screw) ×1 IMPLANT
SCREW LOCKING STRL 4.75X25X3.5 (Screw) ×1 IMPLANT
SHOULDER HUMERAL BEAR 36M STD (Shoulder) ×2 IMPLANT
SLING ARM IMMOBILIZER LRG (SOFTGOODS) ×2 IMPLANT
SOL PREP POV-IOD 4OZ 10% (MISCELLANEOUS) ×2 IMPLANT
SPONGE LAP 18X18 RF (DISPOSABLE) ×2 IMPLANT
STEM HUMERAL STRL 9MMX83MM (Stem) ×1 IMPLANT
STRIP CLOSURE SKIN 1/2X4 (GAUZE/BANDAGES/DRESSINGS) ×2 IMPLANT
SUCTION FRAZIER HANDLE 10FR (MISCELLANEOUS) ×2
SUCTION TUBE FRAZIER 10FR DISP (MISCELLANEOUS) ×1 IMPLANT
SUT BROADBAND TAPE 2PK 1.5 (SUTURE) ×2 IMPLANT
SUT MNCRL AB 3-0 PS2 18 (SUTURE) ×2 IMPLANT
SUT SILK 2 0 TIES 10X30 (SUTURE) ×2 IMPLANT
SUT VIC AB 0 CT1 27 (SUTURE) ×6
SUT VIC AB 0 CT1 27XBRD ANBCTR (SUTURE) ×4 IMPLANT
SUT VIC AB 1 CT1 27 (SUTURE) ×6
SUT VIC AB 1 CT1 27XBRD ANBCTR (SUTURE) ×2 IMPLANT
SUT VIC AB 2-0 CT1 27 (SUTURE) ×6
SUT VIC AB 2-0 CT1 TAPERPNT 27 (SUTURE) ×3 IMPLANT
SUT VICRYL 0 UR6 27IN ABS (SUTURE) ×6 IMPLANT
TOWEL GREEN STERILE (TOWEL DISPOSABLE) ×2 IMPLANT
TRAY HUM REV SHOULDER STD +6 (Shoulder) ×1 IMPLANT
WATER STERILE IRR 1000ML POUR (IV SOLUTION) ×2 IMPLANT

## 2020-10-10 NOTE — Anesthesia Postprocedure Evaluation (Signed)
Anesthesia Post Note  Patient: [name]  Procedure(s) Performed: RIGHT REVERSE SHOULDER ARTHROPLASTY (Right Shoulder)     Patient location during evaluation: PACU Anesthesia Type: General Level of consciousness: awake and alert Pain management: pain level controlled Vital Signs Assessment: post-procedure vital signs reviewed and stable Respiratory status: spontaneous breathing, nonlabored ventilation and respiratory function stable Cardiovascular status: blood pressure returned to baseline and stable Postop Assessment: no apparent nausea or vomiting Anesthetic complications: no   No complications documented.  Last Vitals:  Vitals:   10/10/20 1223 10/10/20 1605  BP: (!) 119/40 (!) 91/48  Pulse: (!) 55 62  Resp: 14 18  Temp:    SpO2: 98% 93%    Last Pain:  Vitals:   10/10/20 1223  TempSrc:   PainSc: 0-No pain                 Lidia Collum

## 2020-10-10 NOTE — H&P (Signed)
[name] is an 70 y.o. female.   Chief Complaint:  HPI: [name] is a 70 year old patient with over 66-month history of right shoulder pain.  Reports decreased range of motion and pain.  She likes to garden.  Denies any history of injury in the right shoulder.  The pain wakes her from sleep.  She describes both weakness and pain in the right shoulder.  Denies any neck pain or numbness and tingling.  MRI scan is reviewed and she does have superior migration of the humeral head along with retracted supraspinatus tear and infraspinatus tear.  The tears are retracted to the level of the humeral head and glenoid.. She has failed nonoperative management for her shoulder pain.  Past Medical History:  Diagnosis Date  . Allergy    seasonal  . Arthritis   . Cancer ([place])    melanoma back  . Complication of anesthesia 2015   ? bp- passed out after in room. / after knee replacement  . Hypertension   . Numbness and tingling of foot    right foot  . Osteopenia   . PONV (postoperative nausea and vomiting)     Past Surgical History:  Procedure Laterality Date  . APPENDECTOMY  80  . CHOLECYSTECTOMY    . HAND SURGERY Right 06   foreign object  . KNEE ARTHROSCOPY Left 82,84   left knee  . KNEE ARTHROSCOPY Right 2010  . KNEE SURGERY Left 1983   ligament  . TOTAL KNEE ARTHROPLASTY Left 09/19/2013   Procedure: TOTAL KNEE ARTHROPLASTY;  Surgeon: Garald Balding, MD;  Location: [place];  Service: Orthopedics;  Laterality: Left;  . TOTAL KNEE ARTHROPLASTY Right 09/22/2016   Procedure: RIGHT TOTAL KNEE ARTHROPLASTY;  Surgeon: Garald Balding, MD;  Location: [place];  Service: Orthopedics;  Laterality: Right;  . TUBAL LIGATION  75    Family History  Problem Relation Age of Onset  . Heart disease Mother   . Lung cancer Father   . Lung cancer Sister   . Heart failure Brother   . Esophageal cancer Paternal Grandfather   . Heart disease Sister   . Pancreatic cancer Paternal Grandmother    Social  History:  reports that she quit smoking about 27 years ago. Her smoking use included cigarettes. She has a 40.00 pack-year smoking history. She has never used smokeless tobacco. She reports current alcohol use. She reports that she does not use drugs.  Allergies:  Allergies  Allergen Reactions  . Norco [Hydrocodone-Acetaminophen] Nausea And Vomiting and Anxiety  . Other Nausea And Vomiting and Anxiety    Darvocet  . Gabapentin Other (See Comments)    Lowered blood pressure     Facility-Administered Medications Prior to Admission  Medication Dose Route Frequency Provider Last Rate Last Admin  . 0.9 %  sodium chloride infusion  500 mL Intravenous Once Nandigam, Venia Minks, MD       Medications Prior to Admission  Medication Sig Dispense Refill  . amLODipine (NORVASC) 5 MG tablet Take 5 mg by mouth daily. Take one pill at lunchtime    . Calcium Carbonate-Vitamin D (CALTRATE 600+D PO) Take 1 tablet by mouth 2 (two) times daily.    . Coenzyme Q10 (CO Q10) 100 MG CAPS Take 100 mg by mouth daily.    . diphenhydrAMINE (BENADRYL) 25 MG tablet Take 25 mg by mouth daily as needed for allergies.    Marland Kitchen lisinopril-hydrochlorothiazide (PRINZIDE,ZESTORETIC) 20-12.5 MG tablet Take 1 tablet by mouth daily at 12 noon.    Marland Kitchen  metoprolol tartrate (LOPRESSOR) 25 MG tablet Take 25 mg by mouth 2 (two) times daily.     . polyethylene glycol (MIRALAX / GLYCOLAX) packet Take 17 g by mouth daily as needed for mild constipation. 14 each 0  . traMADol (ULTRAM) 50 MG tablet Take 100 mg by mouth every evening.    . tretinoin (RETIN-A) 0.05 % cream Apply 1 application topically every morning. Apply to face      No results found for this or any previous visit (from the past 48 hour(s)). No results found.  Review of Systems  Musculoskeletal: Positive for arthralgias.  All other systems reviewed and are negative.   Blood pressure (!) 115/41, pulse (!) 51, temperature 97.6 F (36.4 C), temperature source Temporal,  resp. rate 12, height 5' (1.524 m), weight 76.2 kg, SpO2 98 %. Physical Exam Vitals reviewed.  HENT:     Head: Normocephalic.     Nose: Nose normal.     Mouth/Throat:     Mouth: Mucous membranes are moist.  Eyes:     Pupils: Pupils are equal, round, and reactive to light.  Cardiovascular:     Rate and Rhythm: Normal rate.     Pulses: Normal pulses.  Pulmonary:     Effort: Pulmonary effort is normal.  Abdominal:     General: Abdomen is flat.  Musculoskeletal:     Cervical back: Normal range of motion.  Skin:    General: Skin is warm.     Capillary Refill: Capillary refill takes less than 2 seconds.  Neurological:     General: No focal deficit present.     Mental Status: She is alert.  Psychiatric:        Mood and Affect: Mood normal.   Examination of the right shoulder demonstrates palpable axillary nerve function.  Weakness to infraspinatus supraspinatus testing.  Subscap strength is reasonable.  Quite apprehensive about moving the shoulder passively.  We can get her to around 45 degrees of forward flexion abduction before she has guarding.  Motor sensory function to the hand is intact.  Radial pulses intact.  Assessment/Plan Impression is right shoulder rotator cuff arthropathy.  Plan is right reverse shoulder replacement.  Risk benefits are discussed with the patient include not limited to infection nerve vessel damage dislocation as well as incomplete pain relief and incomplete restoration of full function.  Patient understands the risk and benefits and wishes to proceed.  All questions answered.  Anticipate CPM use for 2 weeks followed by home health physical therapy also during that 2 weeks.  After that we will start with outpatient physical therapy.  Patient understands the expected rehab course as well.  Anderson Malta, MD 10/10/2020, 11:47 AM

## 2020-10-10 NOTE — Anesthesia Preprocedure Evaluation (Addendum)
Anesthesia Evaluation  Patient identified by MRN, date of birth, ID band Patient awake    Reviewed: Allergy & Precautions, NPO status , Patient's Chart, lab work & pertinent test results  History of Anesthesia Complications (+) PONV and history of anesthetic complications  Airway Mallampati: II  TM Distance: >3 FB Neck ROM: Full    Dental  (+) Dental Advisory Given, Caps, Teeth Intact   Pulmonary former smoker,    Pulmonary exam normal breath sounds clear to auscultation       Cardiovascular hypertension, Pt. on medications and Pt. on home beta blockers Normal cardiovascular exam Rhythm:Regular Rate:Normal     Neuro/Psych negative neurological ROS     GI/Hepatic negative GI ROS, Neg liver ROS,   Endo/Other  negative endocrine ROS  Renal/GU negative Renal ROS     Musculoskeletal  (+) Arthritis ,   Abdominal (+) + obese,   Peds  Hematology negative hematology ROS (+)   Anesthesia Other Findings   Reproductive/Obstetrics                            Anesthesia Physical Anesthesia Plan  ASA: II  Anesthesia Plan: General   Post-op Pain Management:    Induction: Intravenous  PONV Risk Score and Plan: 4 or greater and Ondansetron, Aprepitant, Dexamethasone, Treatment may vary due to age or medical condition, Propofol infusion and Scopolamine patch - Pre-op  Airway Management Planned: Oral ETT  Additional Equipment: None  Intra-op Plan:   Post-operative Plan: Extubation in OR  Informed Consent: I have reviewed the patients History and Physical, chart, labs and discussed the procedure including the risks, benefits and alternatives for the proposed anesthesia with the patient or authorized representative who has indicated his/her understanding and acceptance.     Dental advisory given  Plan Discussed with: CRNA  Anesthesia Plan Comments:        Anesthesia Quick  Evaluation

## 2020-10-10 NOTE — Brief Op Note (Signed)
   10/10/2020  4:06 PM  PATIENT:  [name]  70 y.o. female  PRE-OPERATIVE DIAGNOSIS:  RIGHT SHOULDER ROTATOR CUFF ARTHROPATHY  POST-OPERATIVE DIAGNOSIS:  RIGHT SHOULDER ROTATOR CUFF ARTHROPATHY  PROCEDURE:  Procedure(s): RIGHT REVERSE SHOULDER ARTHROPLASTY  SURGEON:  Surgeon(s): Marlou Sa, Tonna Corner, MD  ASSISTANT: Magnant pa  ANESTHESIA:   general  EBL: 50 ml    Total I/O In: 700 [I.V.:700] Out: 50 [Blood:50]  BLOOD ADMINISTERED: none  DRAINS: none   LOCAL MEDICATIONS USED:  vanco  SPECIMEN:  No Specimen  COUNTS:  YES  TOURNIQUET:  * No tourniquets in log *  DICTATION: .Other Dictation: Dictation Number [id number]  PLAN OF CARE: Discharge to home after PACU  PATIENT DISPOSITION:  PACU - hemodynamically stable

## 2020-10-10 NOTE — Progress Notes (Signed)
Walked patient in halls > 150ft with minimal assist. Posture is erect, gait is steady. Tolerated well.

## 2020-10-10 NOTE — Progress Notes (Signed)
Patient states she took a "cocktail" this morning at 0830 that was ordered by Dr Randel Pigg office.  Called MD's office [phone], spoke with Lurena Joiner, [place] who stated patient was prescribed celebrex 400 mg, tramadol 50 mg and tylenol 500 mg one time dose on DOS.  Patient took these meds at 0830 am along with her ERAS drink (completed at 0830).

## 2020-10-10 NOTE — Anesthesia Procedure Notes (Signed)
Procedure Name: Intubation Date/Time: 10/10/2020 12:38 PM Performed by: Valda Favia, CRNA Pre-anesthesia Checklist: Patient identified, Emergency Drugs available, Suction available and Patient being monitored Patient Re-evaluated:Patient Re-evaluated prior to induction Oxygen Delivery Method: Circle System Utilized Preoxygenation: Pre-oxygenation with 100% oxygen Induction Type: IV induction Ventilation: Mask ventilation without difficulty Laryngoscope Size: Mac and 4 Grade View: Grade I Tube type: Oral Tube size: 7.0 mm Number of attempts: 1 Airway Equipment and Method: Stylet and Oral airway Placement Confirmation: ETT inserted through vocal cords under direct vision,  positive ETCO2 and breath sounds checked- equal and bilateral Secured at: 21 cm Tube secured with: Tape Dental Injury: Teeth and Oropharynx as per pre-operative assessment

## 2020-10-10 NOTE — Anesthesia Procedure Notes (Signed)
Anesthesia Regional Block: Interscalene brachial plexus block   Pre-Anesthetic Checklist: ,, timeout performed, Correct Patient, Correct Site, Correct Laterality, Correct Procedure, Correct Position, site marked, Risks and benefits discussed,  Surgical consent,  Pre-op evaluation,  At surgeon's request and post-op pain management  Laterality: Right  Prep: chloraprep       Needles:  Injection technique: Single-shot  Needle Type: Stimulator Needle - 40     Needle Length: 4cm  Needle Gauge: 22     Additional Needles:   Procedures:,,,, ultrasound used (permanent image in chart),,,,  Narrative:  Start time: 10/10/2020 12:06 PM End time: 10/10/2020 12:11 PM Injection made incrementally with aspirations every 5 mL. Anesthesiologist: Nolon Nations, MD  Additional Notes: BP cuff, EKG monitors applied. Sedation begun. Nerve location verified with U/S. Anesthetic injected incrementally, slowly , and after neg aspirations under direct u/s guidance. Good perineural spread. Tolerated well.

## 2020-10-10 NOTE — Progress Notes (Signed)
   10/10/20 1705  TOC ED Mini Assessment  TOC Time spent with patient (minutes): 15  PING Used in TOC Assessment No (n/a)  Admission or Readmission Diverted  (n/a)  Barrier interventions Patient being discharge from [facility] PACU s/p  [place].  Home Health orders noted by PACU RN. RNCM reviewed Face to Face orders PT/OT. Patient has no preference of [place] agency. [facility] is in [employer], referral sent in to [place] liaison for [facility], referral accepted, CM will f/u tomorrow 2/4  Information placed on AVS. No further RNCM needs identified

## 2020-10-10 NOTE — Transfer of Care (Signed)
Immediate Anesthesia Transfer of Care Note  Patient: [name]  Procedure(s) Performed: RIGHT REVERSE SHOULDER ARTHROPLASTY (Right Shoulder)  Patient Location: PACU  Anesthesia Type:GA combined with regional for post-op pain  Level of Consciousness: drowsy and patient cooperative  Airway & Oxygen Therapy: Patient Spontanous Breathing  Post-op Assessment: Report given to RN and Post -op Vital signs reviewed and stable  Post vital signs: Reviewed and stable  Last Vitals:  Vitals Value Taken Time  BP    Temp    Pulse 61 10/10/20 1611  Resp 19 10/10/20 1611  SpO2 93 % 10/10/20 1611  Vitals shown include unvalidated device data.  Last Pain:  Vitals:   10/10/20 1223  TempSrc:   PainSc: 0-No pain      Patients Stated Pain Goal: 3 (45/80/99 8338)  Complications: No complications documented.

## 2020-10-11 ENCOUNTER — Encounter (HOSPITAL_COMMUNITY): Payer: Self-pay | Admitting: Orthopedic Surgery

## 2020-10-11 NOTE — Op Note (Signed)
NAMESARYIAH, BENCOSME MEDICAL RECORD [id number] ACCOUNT [id number] DATE OF BIRTH:Dec 05, 1950 FACILITY: [facility] LOCATION: [facility] PHYSICIAN:Maia Handa Randel Pigg, MD  OPERATIVE REPORT  DATE OF PROCEDURE:  10/10/2020  PREOPERATIVE DIAGNOSIS:  Right shoulder rotator cuff arthropathy.  POSTOPERATIVE DIAGNOSIS:  Right shoulder rotator cuff arthropathy.  PROCEDURE:  Right reverse shoulder replacement.  SURGEON:  Meredith Pel, MD  ASSISTANT:  Annie Main, PA  INDICATIONS:  The patient is a 70 year old patient with end-stage rotator cuff arthropathy in the right shoulder, refractory to nonoperative management, who presents for operative management after explanation of risks and benefits.  PROCEDURE IN DETAIL:  The patient was brought to the operating room where general anesthetic was induced.  Preoperative antibiotics administered.  Timeout was called.  The patient was placed in the beach chair position with the head in neutral position.   Right arm prescrubbed with hydrogen peroxide, alcohol and Betadine, which was allowed to air dry.  The patient did undergo benzoyl peroxide prescrubbing 3 days prior to surgery.  The right arm, hand and shoulder was then prepped with ChloraPrep solution  and draped in sterile manner, covered with Ioban.  Timeout was called.  Deltopectoral approach was made.  Skin and subcutaneous tissue were sharply divided.  Cephalic vein mobilized laterally.  Pec tendon was released about a centimeter.  Biceps tendon  was tenodesed to the pec tendon using four 0 Vicry Vicryl sutures.  Rotator interval was then opened.  Infraspinatus supraspinatus was torn and retracted.  Subscap was intact.  Subdeltoid adhesions were removed.  The deltoid was elevated at anterior  attachment manually.  Circumflex vessels were ligated.  Axillary nerve visualized and a vessel loop was placed around it.  Protected at all times during the remaining portion of the case.  The subscap was then  detached along with the capsule down to the  5 o'clock position.  Humeral head was dislocated.  Reaming was performed up to 9 mm.  Broaching then performed up to a size 9.  A humeral head cut was made in 30 degrees of retroversion.  Next, broaching was performed up to a size 9.  A cap was placed.   Attention then directed towards the glenoid side.  Posterior retractor was placed.  A circumferential labral resection was performed.  Anterior retractor was placed.  A capsule was released with protection of the axillary nerve performed.  Next, the  guidepin was placed using patient specific instrumentation.  Reaming was performed to a depth of about 7 mm.  Next, the augment reaming was performed which was primarily at about the 11:30 position.  At this time, thorough irrigation was performed.   IrriSept solution was used after the incision as well as after the arthrotomy.  The glenoid baseplate was then placed with excellent fixation achieved at the central compression screw.  Four peripheral locking screws were also placed.  Based on the  spacing, a +3 glenosphere was utilized, which was size 36+3 offset.  Baseplate was small augmented baseplate with 1 central screw and 4 peripheral locking screws.  Glenosphere was placed.  Attention was then redirected towards the humerus.  Size 9 stem  was placed after drilling holes for subscap repair.  A trial reduction was performed with the standard thickness mini humeral tray +6 mm taper offset, 40 mm diameter along with 36 mm standard bearing.  This gave excellent stability and was hard to  reduce.  The patient had excellent range of motion, forward flexion, abduction both above 90 degrees.  Greater tuberosity  tuberoplasty performed.  No impingement present with internal and external rotation at 90 degrees.  True component placed.  A  thorough irrigation performed.  Same stability parameters were maintained with excellent stability with shoulder extension, adduction and  forward motion.  Axillary nerve was palpated and intact.  Thorough irrigation was then performed.  Vancomycin powder  placed.  Subscap was repaired using 4 Biomet suture tapes.  This was done with the arm in about 30 degrees of external rotation.  IrriSept solution, then utilized and the deltopectoral interval was then closed using #1 Vicryl suture followed by  interrupted inverted 0 Vicryl suture, 2-0 Vicryl suture and 3-0 Monocryl.  Steri-Strips, Aquacel dressing applied.  Luke's assistance was required at all times during the case for opening and closing, retraction and mobilization of tissue.  His  assistance was a medical necessity.  HN/NUANCE  D:10/10/2020 T:10/10/2020 JOB:[id number]

## 2020-10-12 DIAGNOSIS — Z87891 Personal history of nicotine dependence: Secondary | ICD-10-CM | POA: Diagnosis not present

## 2020-10-12 DIAGNOSIS — Z96611 Presence of right artificial shoulder joint: Secondary | ICD-10-CM | POA: Diagnosis not present

## 2020-10-12 DIAGNOSIS — Z96653 Presence of artificial knee joint, bilateral: Secondary | ICD-10-CM | POA: Diagnosis not present

## 2020-10-12 DIAGNOSIS — I10 Essential (primary) hypertension: Secondary | ICD-10-CM | POA: Diagnosis not present

## 2020-10-12 DIAGNOSIS — M8580 Other specified disorders of bone density and structure, unspecified site: Secondary | ICD-10-CM | POA: Diagnosis not present

## 2020-10-12 DIAGNOSIS — M1990 Unspecified osteoarthritis, unspecified site: Secondary | ICD-10-CM | POA: Diagnosis not present

## 2020-10-12 DIAGNOSIS — Z471 Aftercare following joint replacement surgery: Secondary | ICD-10-CM | POA: Diagnosis not present

## 2020-10-12 DIAGNOSIS — R32 Unspecified urinary incontinence: Secondary | ICD-10-CM | POA: Diagnosis not present

## 2020-10-12 DIAGNOSIS — Z85820 Personal history of malignant melanoma of skin: Secondary | ICD-10-CM | POA: Diagnosis not present

## 2020-10-20 DIAGNOSIS — M19011 Primary osteoarthritis, right shoulder: Secondary | ICD-10-CM

## 2020-10-23 ENCOUNTER — Ambulatory Visit (INDEPENDENT_AMBULATORY_CARE_PROVIDER_SITE_OTHER): Payer: Medicare HMO

## 2020-10-23 ENCOUNTER — Ambulatory Visit (INDEPENDENT_AMBULATORY_CARE_PROVIDER_SITE_OTHER): Payer: Medicare HMO | Admitting: Orthopedic Surgery

## 2020-10-23 ENCOUNTER — Other Ambulatory Visit: Payer: Self-pay

## 2020-10-23 DIAGNOSIS — Z96611 Presence of right artificial shoulder joint: Secondary | ICD-10-CM

## 2020-10-24 ENCOUNTER — Encounter: Payer: Self-pay | Admitting: Orthopedic Surgery

## 2020-10-24 NOTE — Progress Notes (Signed)
Post-Op Visit Note   Patient: [name]           Date of Birth: May 28, 1951           MRN: [id number] Visit Date: 10/23/2020 PCP: Caryl Bis, MD   Assessment & Plan:  Chief Complaint:  Chief Complaint  Patient presents with  . Right Shoulder - Routine Post Op   Visit Diagnoses:  1. History of arthroplasty of right shoulder     Plan: Patient is a 70 year old female presents s/p right reverse shoulder arthroplasty on 10/10/2020.  Patient states that she is doing well and she is up to 90 degrees on CPM machine.  She is wearing her sling.  She has not had to take any pain medication since the Saturday following her surgery.  She does have occasional difficulty sleeping because she does not like to sleep on her back.  Denies any fevers, chills, drainage from the incision.  On exam she has 25 degrees external rotation, 90 degrees abduction, 90 degrees forward flexion.  Incision is healing well with no evidence of infection dehiscence.  Axillary nerve intact with deltoid firing.  Subscap with decent strength.  Radiographs show right reverse shoulder prosthesis in good position and alignment without any complicating features.  Plan to start physical therapy outpatient at [facility].  Follow-up in 4 weeks for clinical recheck with Dr. Marlou Sa.  Follow-Up Instructions: No follow-ups on file.   Orders:  Orders Placed This Encounter  Procedures  . XR Shoulder Right   No orders of the defined types were placed in this encounter.   Imaging: No results found.  PMFS History: Patient Active Problem List   Diagnosis Date Noted  . Arthritis of right shoulder region   . Complete tear of right rotator cuff 06/06/2020  . S/P total knee replacement using cement, right 09/22/2016  . Unilateral primary osteoarthritis, right knee 09/09/2016  . Osteoarthritis of left knee 09/20/2013  . Obesity, unspecified 09/20/2013  . S/P total knee replacement using cement 09/19/2013   Past Medical  History:  Diagnosis Date  . Allergy    seasonal  . Arthritis   . Cancer ([place])    melanoma back  . Complication of anesthesia 2015   ? bp- passed out after in room. / after knee replacement  . Hypertension   . Numbness and tingling of foot    right foot  . Osteopenia   . PONV (postoperative nausea and vomiting)     Family History  Problem Relation Age of Onset  . Heart disease Mother   . Lung cancer Father   . Lung cancer Sister   . Heart failure Brother   . Esophageal cancer Paternal Grandfather   . Heart disease Sister   . Pancreatic cancer Paternal Grandmother     Past Surgical History:  Procedure Laterality Date  . APPENDECTOMY  80  . CHOLECYSTECTOMY    . HAND SURGERY Right 06   foreign object  . KNEE ARTHROSCOPY Left 82,84   left knee  . KNEE ARTHROSCOPY Right 2010  . KNEE SURGERY Left 1983   ligament  . REVERSE SHOULDER ARTHROPLASTY Right 10/10/2020   Procedure: RIGHT REVERSE SHOULDER ARTHROPLASTY;  Surgeon: Meredith Pel, MD;  Location: [place];  Service: Orthopedics;  Laterality: Right;  . TOTAL KNEE ARTHROPLASTY Left 09/19/2013   Procedure: TOTAL KNEE ARTHROPLASTY;  Surgeon: Garald Balding, MD;  Location: [place];  Service: Orthopedics;  Laterality: Left;  . TOTAL KNEE ARTHROPLASTY Right 09/22/2016  Procedure: RIGHT TOTAL KNEE ARTHROPLASTY;  Surgeon: Garald Balding, MD;  Location: [place];  Service: Orthopedics;  Laterality: Right;  . TUBAL LIGATION  75   Social History   Occupational History  . Not on file  Tobacco Use  . Smoking status: Former Smoker    Packs/day: 2.00    Years: 20.00    Pack years: 40.00    Types: Cigarettes    Quit date: 07/10/1993    Years since quitting: 27.3  . Smokeless tobacco: Never Used  Vaping Use  . Vaping Use: Never used  Substance and Sexual Activity  . Alcohol use: Yes    Comment: occ wine  . Drug use: No  . Sexual activity: Yes    Birth control/protection: Post-menopausal

## 2020-10-26 ENCOUNTER — Encounter: Payer: Self-pay | Admitting: Orthopedic Surgery

## 2020-10-28 DIAGNOSIS — M75101 Unspecified rotator cuff tear or rupture of right shoulder, not specified as traumatic: Secondary | ICD-10-CM | POA: Diagnosis not present

## 2020-10-28 DIAGNOSIS — S43421A Sprain of right rotator cuff capsule, initial encounter: Secondary | ICD-10-CM | POA: Diagnosis not present

## 2020-10-30 DIAGNOSIS — M75101 Unspecified rotator cuff tear or rupture of right shoulder, not specified as traumatic: Secondary | ICD-10-CM | POA: Diagnosis not present

## 2020-10-30 DIAGNOSIS — S43421A Sprain of right rotator cuff capsule, initial encounter: Secondary | ICD-10-CM | POA: Diagnosis not present

## 2020-11-04 DIAGNOSIS — M75101 Unspecified rotator cuff tear or rupture of right shoulder, not specified as traumatic: Secondary | ICD-10-CM | POA: Diagnosis not present

## 2020-11-04 DIAGNOSIS — S43421A Sprain of right rotator cuff capsule, initial encounter: Secondary | ICD-10-CM | POA: Diagnosis not present

## 2020-11-06 DIAGNOSIS — S43421A Sprain of right rotator cuff capsule, initial encounter: Secondary | ICD-10-CM | POA: Diagnosis not present

## 2020-11-06 DIAGNOSIS — L570 Actinic keratosis: Secondary | ICD-10-CM | POA: Diagnosis not present

## 2020-11-06 DIAGNOSIS — D239 Other benign neoplasm of skin, unspecified: Secondary | ICD-10-CM | POA: Diagnosis not present

## 2020-11-06 DIAGNOSIS — M75101 Unspecified rotator cuff tear or rupture of right shoulder, not specified as traumatic: Secondary | ICD-10-CM | POA: Diagnosis not present

## 2020-11-06 DIAGNOSIS — M71349 Other bursal cyst, unspecified hand: Secondary | ICD-10-CM | POA: Diagnosis not present

## 2020-11-11 DIAGNOSIS — S43421A Sprain of right rotator cuff capsule, initial encounter: Secondary | ICD-10-CM | POA: Diagnosis not present

## 2020-11-11 DIAGNOSIS — M75101 Unspecified rotator cuff tear or rupture of right shoulder, not specified as traumatic: Secondary | ICD-10-CM | POA: Diagnosis not present

## 2020-11-13 DIAGNOSIS — R69 Illness, unspecified: Secondary | ICD-10-CM | POA: Diagnosis not present

## 2020-11-13 DIAGNOSIS — H5200 Hypermetropia, unspecified eye: Secondary | ICD-10-CM | POA: Diagnosis not present

## 2020-11-18 DIAGNOSIS — M75101 Unspecified rotator cuff tear or rupture of right shoulder, not specified as traumatic: Secondary | ICD-10-CM | POA: Diagnosis not present

## 2020-11-18 DIAGNOSIS — S43421A Sprain of right rotator cuff capsule, initial encounter: Secondary | ICD-10-CM | POA: Diagnosis not present

## 2020-11-20 ENCOUNTER — Other Ambulatory Visit: Payer: Self-pay

## 2020-11-20 ENCOUNTER — Ambulatory Visit (INDEPENDENT_AMBULATORY_CARE_PROVIDER_SITE_OTHER): Payer: Medicare HMO | Admitting: Orthopedic Surgery

## 2020-11-20 DIAGNOSIS — S43421A Sprain of right rotator cuff capsule, initial encounter: Secondary | ICD-10-CM | POA: Diagnosis not present

## 2020-11-20 DIAGNOSIS — Z96611 Presence of right artificial shoulder joint: Secondary | ICD-10-CM

## 2020-11-20 DIAGNOSIS — M75101 Unspecified rotator cuff tear or rupture of right shoulder, not specified as traumatic: Secondary | ICD-10-CM | POA: Diagnosis not present

## 2020-11-21 ENCOUNTER — Encounter: Payer: Self-pay | Admitting: Orthopedic Surgery

## 2020-11-21 NOTE — Progress Notes (Signed)
Post-Op Visit Note   Patient: [name]           Date of Birth: September 01, 1951           MRN: [id number] Visit Date: 11/20/2020 PCP: Caryl Bis, MD   Assessment & Plan:  Chief Complaint:  Chief Complaint  Patient presents with  . Right Shoulder - Routine Post Op   Visit Diagnoses:  1. History of arthroplasty of right shoulder     Plan: Gaylia is a 70 year old patient underwent right reverse shoulder replacement 10/10/2020.  She is doing well.  States her shoulder feels well.  She states she is very happy with her surgical result.  Therapy has been going on 2 times a week.  On exam she has excellent forward flexion external rotation internal rotation strength and range of motion.  Plan at this time is to continue therapy progressing to home exercise program.  Cautioned her about lifting limits.  Subscap strength is intact.  Follow-up as needed.  Follow-Up Instructions: Return if symptoms worsen or fail to improve.   Orders:  No orders of the defined types were placed in this encounter.  No orders of the defined types were placed in this encounter.   Imaging: No results found.  PMFS History: Patient Active Problem List   Diagnosis Date Noted  . Arthritis of right shoulder region   . Complete tear of right rotator cuff 06/06/2020  . S/P total knee replacement using cement, right 09/22/2016  . Unilateral primary osteoarthritis, right knee 09/09/2016  . Osteoarthritis of left knee 09/20/2013  . Obesity, unspecified 09/20/2013  . S/P total knee replacement using cement 09/19/2013   Past Medical History:  Diagnosis Date  . Allergy    seasonal  . Arthritis   . Cancer ([place])    melanoma back  . Complication of anesthesia 2015   ? bp- passed out after in room. / after knee replacement  . Hypertension   . Numbness and tingling of foot    right foot  . Osteopenia   . PONV (postoperative nausea and vomiting)     Family History  Problem Relation Age of Onset  .  Heart disease Mother   . Lung cancer Father   . Lung cancer Sister   . Heart failure Brother   . Esophageal cancer Paternal Grandfather   . Heart disease Sister   . Pancreatic cancer Paternal Grandmother     Past Surgical History:  Procedure Laterality Date  . APPENDECTOMY  80  . CHOLECYSTECTOMY    . HAND SURGERY Right 06   foreign object  . KNEE ARTHROSCOPY Left 82,84   left knee  . KNEE ARTHROSCOPY Right 2010  . KNEE SURGERY Left 1983   ligament  . REVERSE SHOULDER ARTHROPLASTY Right 10/10/2020   Procedure: RIGHT REVERSE SHOULDER ARTHROPLASTY;  Surgeon: Meredith Pel, MD;  Location: [facility];  Service: Orthopedics;  Laterality: Right;  . TOTAL KNEE ARTHROPLASTY Left 09/19/2013   Procedure: TOTAL KNEE ARTHROPLASTY;  Surgeon: Garald Balding, MD;  Location: [facility];  Service: Orthopedics;  Laterality: Left;  . TOTAL KNEE ARTHROPLASTY Right 09/22/2016   Procedure: RIGHT TOTAL KNEE ARTHROPLASTY;  Surgeon: Garald Balding, MD;  Location: [facility];  Service: Orthopedics;  Laterality: Right;  . TUBAL LIGATION  75   Social History   Occupational History  . Not on file  Tobacco Use  . Smoking status: Former Smoker    Packs/day: 2.00    Years: 20.00  Pack years: 40.00    Types: Cigarettes    Quit date: 07/10/1993    Years since quitting: 27.3  . Smokeless tobacco: Never Used  Vaping Use  . Vaping Use: Never used  Substance and Sexual Activity  . Alcohol use: Yes    Comment: occ wine  . Drug use: No  . Sexual activity: Yes    Birth control/protection: Post-menopausal

## 2020-11-27 DIAGNOSIS — S43421A Sprain of right rotator cuff capsule, initial encounter: Secondary | ICD-10-CM | POA: Diagnosis not present

## 2020-11-27 DIAGNOSIS — M75101 Unspecified rotator cuff tear or rupture of right shoulder, not specified as traumatic: Secondary | ICD-10-CM | POA: Diagnosis not present

## 2020-12-04 DIAGNOSIS — M75101 Unspecified rotator cuff tear or rupture of right shoulder, not specified as traumatic: Secondary | ICD-10-CM | POA: Diagnosis not present

## 2020-12-04 DIAGNOSIS — S43421A Sprain of right rotator cuff capsule, initial encounter: Secondary | ICD-10-CM | POA: Diagnosis not present

## 2020-12-11 DIAGNOSIS — S43421A Sprain of right rotator cuff capsule, initial encounter: Secondary | ICD-10-CM | POA: Diagnosis not present

## 2020-12-11 DIAGNOSIS — M75101 Unspecified rotator cuff tear or rupture of right shoulder, not specified as traumatic: Secondary | ICD-10-CM | POA: Diagnosis not present

## 2020-12-18 DIAGNOSIS — S43421A Sprain of right rotator cuff capsule, initial encounter: Secondary | ICD-10-CM | POA: Diagnosis not present

## 2020-12-18 DIAGNOSIS — M75101 Unspecified rotator cuff tear or rupture of right shoulder, not specified as traumatic: Secondary | ICD-10-CM | POA: Diagnosis not present

## 2020-12-30 DIAGNOSIS — Z0100 Encounter for examination of eyes and vision without abnormal findings: Secondary | ICD-10-CM | POA: Diagnosis not present

## 2021-04-04 ENCOUNTER — Other Ambulatory Visit (HOSPITAL_COMMUNITY): Payer: Self-pay | Admitting: Family Medicine

## 2021-04-04 DIAGNOSIS — I10 Essential (primary) hypertension: Secondary | ICD-10-CM | POA: Diagnosis not present

## 2021-04-04 DIAGNOSIS — E7849 Other hyperlipidemia: Secondary | ICD-10-CM | POA: Diagnosis not present

## 2021-04-04 DIAGNOSIS — E782 Mixed hyperlipidemia: Secondary | ICD-10-CM | POA: Diagnosis not present

## 2021-04-04 DIAGNOSIS — Z1159 Encounter for screening for other viral diseases: Secondary | ICD-10-CM | POA: Diagnosis not present

## 2021-04-04 DIAGNOSIS — Z1329 Encounter for screening for other suspected endocrine disorder: Secondary | ICD-10-CM | POA: Diagnosis not present

## 2021-04-04 DIAGNOSIS — R5383 Other fatigue: Secondary | ICD-10-CM | POA: Diagnosis not present

## 2021-04-04 DIAGNOSIS — Z1231 Encounter for screening mammogram for malignant neoplasm of breast: Secondary | ICD-10-CM

## 2021-04-04 DIAGNOSIS — N1832 Chronic kidney disease, stage 3b: Secondary | ICD-10-CM | POA: Diagnosis not present

## 2021-04-09 DIAGNOSIS — R4582 Worries: Secondary | ICD-10-CM | POA: Diagnosis not present

## 2021-04-09 DIAGNOSIS — I10 Essential (primary) hypertension: Secondary | ICD-10-CM | POA: Diagnosis not present

## 2021-04-09 DIAGNOSIS — Z0001 Encounter for general adult medical examination with abnormal findings: Secondary | ICD-10-CM | POA: Diagnosis not present

## 2021-04-09 DIAGNOSIS — E7849 Other hyperlipidemia: Secondary | ICD-10-CM | POA: Diagnosis not present

## 2021-04-09 DIAGNOSIS — R69 Illness, unspecified: Secondary | ICD-10-CM | POA: Diagnosis not present

## 2021-04-09 DIAGNOSIS — M1711 Unilateral primary osteoarthritis, right knee: Secondary | ICD-10-CM | POA: Diagnosis not present

## 2021-04-09 DIAGNOSIS — Z1212 Encounter for screening for malignant neoplasm of rectum: Secondary | ICD-10-CM | POA: Diagnosis not present

## 2021-04-09 DIAGNOSIS — N1832 Chronic kidney disease, stage 3b: Secondary | ICD-10-CM | POA: Diagnosis not present

## 2021-04-14 ENCOUNTER — Other Ambulatory Visit: Payer: Self-pay

## 2021-04-14 ENCOUNTER — Ambulatory Visit (HOSPITAL_COMMUNITY)
Admission: RE | Admit: 2021-04-14 | Discharge: 2021-04-14 | Disposition: A | Discharge status: Home / Self Care | Payer: Medicare HMO | Source: Ambulatory Visit | Attending: Family Medicine | Admitting: Family Medicine

## 2021-04-14 DIAGNOSIS — Z1231 Encounter for screening mammogram for malignant neoplasm of breast: Secondary | ICD-10-CM | POA: Diagnosis not present

## 2021-04-28 DIAGNOSIS — Z7722 Contact with and (suspected) exposure to environmental tobacco smoke (acute) (chronic): Secondary | ICD-10-CM | POA: Diagnosis not present

## 2021-04-28 DIAGNOSIS — G629 Polyneuropathy, unspecified: Secondary | ICD-10-CM | POA: Diagnosis not present

## 2021-04-28 DIAGNOSIS — R69 Illness, unspecified: Secondary | ICD-10-CM | POA: Diagnosis not present

## 2021-04-28 DIAGNOSIS — Z809 Family history of malignant neoplasm, unspecified: Secondary | ICD-10-CM | POA: Diagnosis not present

## 2021-04-28 DIAGNOSIS — E669 Obesity, unspecified: Secondary | ICD-10-CM | POA: Diagnosis not present

## 2021-04-28 DIAGNOSIS — I10 Essential (primary) hypertension: Secondary | ICD-10-CM | POA: Diagnosis not present

## 2021-04-28 DIAGNOSIS — M1990 Unspecified osteoarthritis, unspecified site: Secondary | ICD-10-CM | POA: Diagnosis not present

## 2021-04-28 DIAGNOSIS — Z79891 Long term (current) use of opiate analgesic: Secondary | ICD-10-CM | POA: Diagnosis not present

## 2021-04-28 DIAGNOSIS — G8929 Other chronic pain: Secondary | ICD-10-CM | POA: Diagnosis not present

## 2021-04-28 DIAGNOSIS — L719 Rosacea, unspecified: Secondary | ICD-10-CM | POA: Diagnosis not present

## 2021-05-21 DIAGNOSIS — D485 Neoplasm of uncertain behavior of skin: Secondary | ICD-10-CM | POA: Diagnosis not present

## 2021-05-21 DIAGNOSIS — L570 Actinic keratosis: Secondary | ICD-10-CM | POA: Diagnosis not present

## 2021-05-21 DIAGNOSIS — D225 Melanocytic nevi of trunk: Secondary | ICD-10-CM | POA: Diagnosis not present

## 2021-06-17 DIAGNOSIS — Z23 Encounter for immunization: Secondary | ICD-10-CM | POA: Diagnosis not present

## 2021-07-28 DIAGNOSIS — L570 Actinic keratosis: Secondary | ICD-10-CM | POA: Diagnosis not present

## 2021-07-28 DIAGNOSIS — Z1283 Encounter for screening for malignant neoplasm of skin: Secondary | ICD-10-CM | POA: Diagnosis not present

## 2021-07-28 DIAGNOSIS — D239 Other benign neoplasm of skin, unspecified: Secondary | ICD-10-CM | POA: Diagnosis not present

## 2021-10-06 DIAGNOSIS — R5383 Other fatigue: Secondary | ICD-10-CM | POA: Diagnosis not present

## 2021-10-06 DIAGNOSIS — I10 Essential (primary) hypertension: Secondary | ICD-10-CM | POA: Diagnosis not present

## 2021-10-06 DIAGNOSIS — E782 Mixed hyperlipidemia: Secondary | ICD-10-CM | POA: Diagnosis not present

## 2021-10-06 DIAGNOSIS — N1832 Chronic kidney disease, stage 3b: Secondary | ICD-10-CM | POA: Diagnosis not present

## 2021-10-06 DIAGNOSIS — E7849 Other hyperlipidemia: Secondary | ICD-10-CM | POA: Diagnosis not present

## 2021-10-09 DIAGNOSIS — Z6830 Body mass index (BMI) 30.0-30.9, adult: Secondary | ICD-10-CM | POA: Diagnosis not present

## 2021-10-09 DIAGNOSIS — M1711 Unilateral primary osteoarthritis, right knee: Secondary | ICD-10-CM | POA: Diagnosis not present

## 2021-10-09 DIAGNOSIS — R4582 Worries: Secondary | ICD-10-CM | POA: Diagnosis not present

## 2021-10-09 DIAGNOSIS — E7849 Other hyperlipidemia: Secondary | ICD-10-CM | POA: Diagnosis not present

## 2021-10-09 DIAGNOSIS — I10 Essential (primary) hypertension: Secondary | ICD-10-CM | POA: Diagnosis not present

## 2021-10-09 DIAGNOSIS — G6289 Other specified polyneuropathies: Secondary | ICD-10-CM | POA: Diagnosis not present

## 2021-10-09 DIAGNOSIS — R69 Illness, unspecified: Secondary | ICD-10-CM | POA: Diagnosis not present

## 2021-10-09 DIAGNOSIS — M19011 Primary osteoarthritis, right shoulder: Secondary | ICD-10-CM | POA: Diagnosis not present

## 2021-10-09 DIAGNOSIS — N1831 Chronic kidney disease, stage 3a: Secondary | ICD-10-CM | POA: Diagnosis not present

## 2021-10-11 IMAGING — CT CT SHOULDER*R* W/O CM
2 of 3 series · 12 of 20 positions shown, 15 images · non-contrast
Comparison: Right shoulder x-rays dated June 06, 2020. MRI
right shoulder dated June 03, 2020.

CLINICAL DATA: Chronic right shoulder pain.

EXAM:
CT OF THE UPPER RIGHT EXTREMITY WITHOUT CONTRAST
TECHNIQUE: Multidetector CT imaging of the right shoulder was performed
according to the standard protocol.

[Series 2: shoulder 2.00 br40 s3 cor soft · coronal · 0.36mm/px · 3 of 57 slices shown]
[im 12/57  bone]
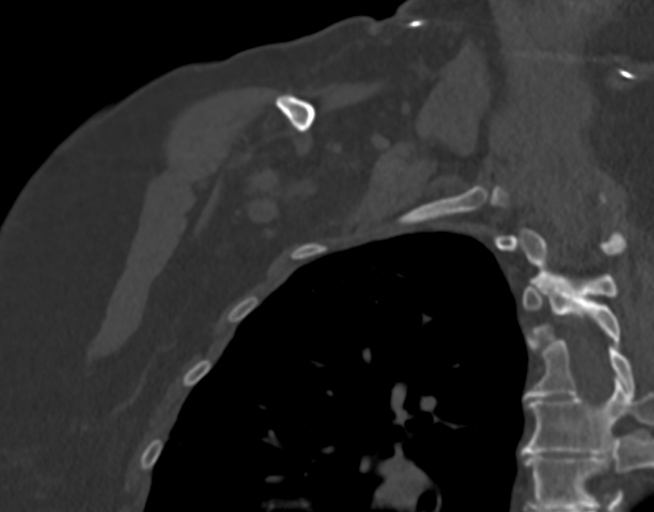
[im 23/57  bone]
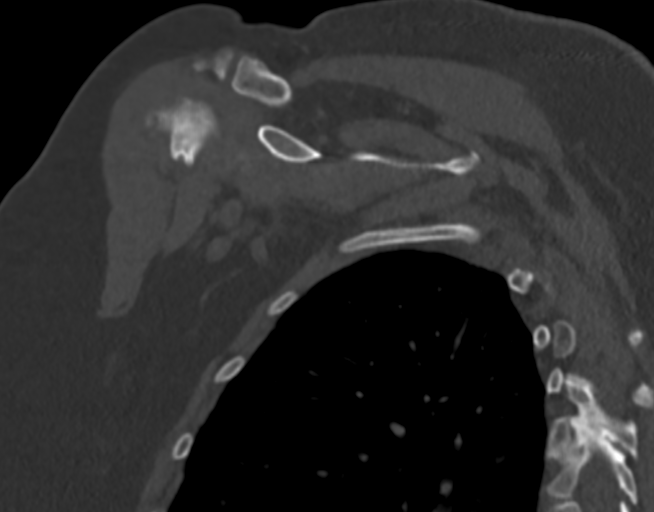
[im 34/57  bone]
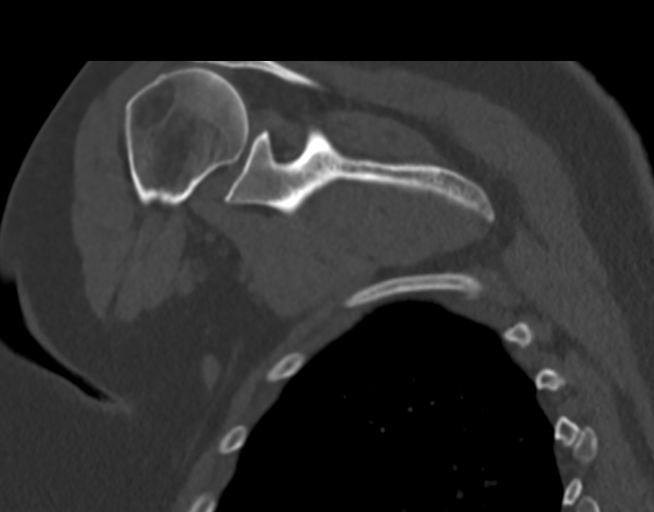

[Series 10: shoulder 0.60 br40 s3 thins soft · axial · 0.43mm/px · z∈[-796,-644]mm · 9 of 317 slices shown, 12 images]
[im 32/317  soft-tissue]
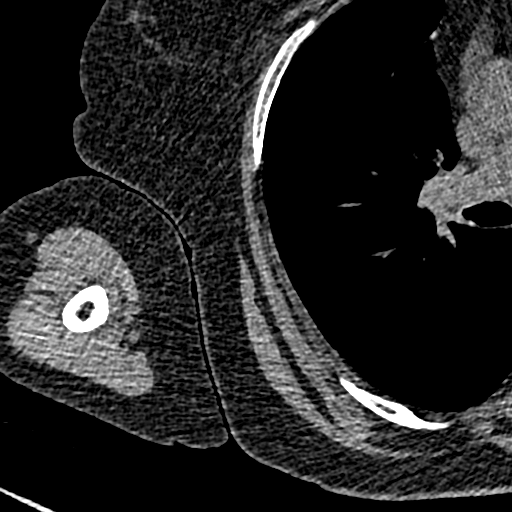
[im 32/317  bone]
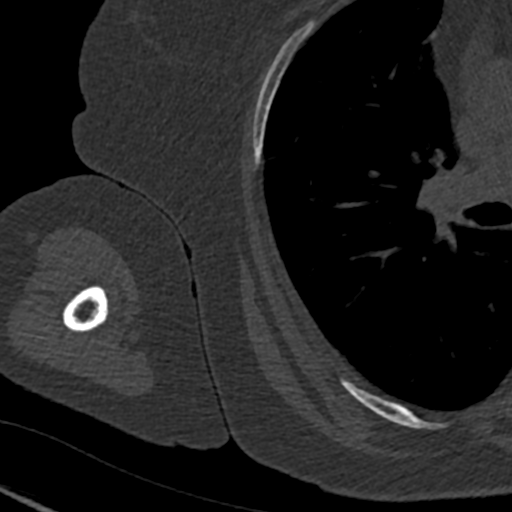
[im 64/317  bone]
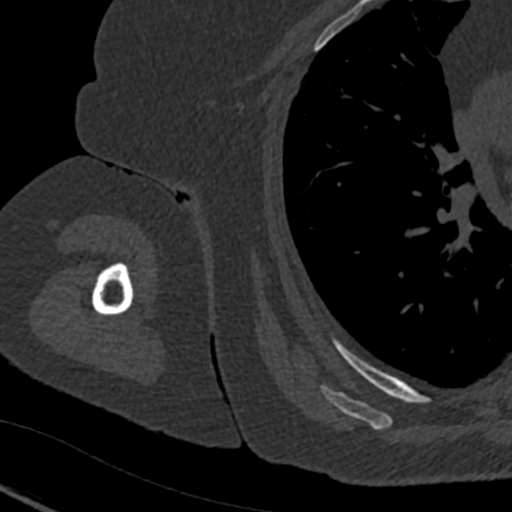
[im 95/317  bone]
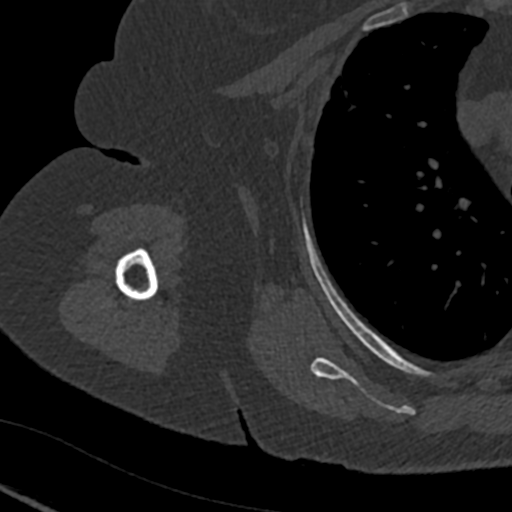
[im 127/317  bone]
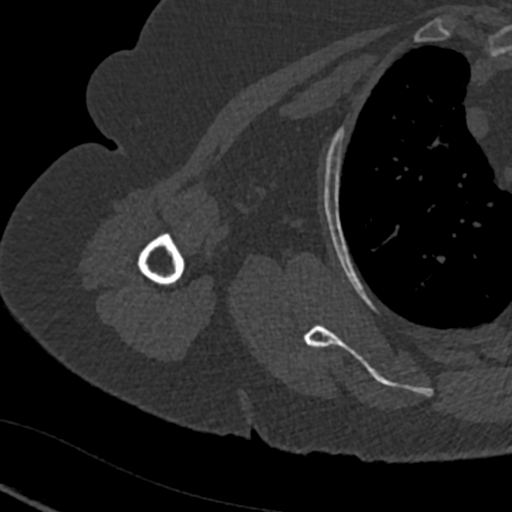
[im 159/317  soft-tissue]
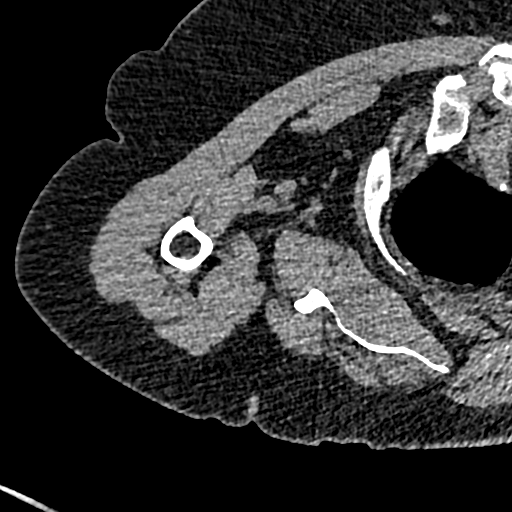
[im 159/317  bone]
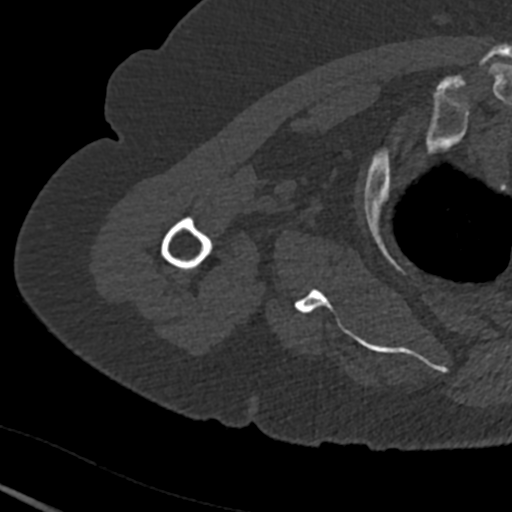
[im 190/317  bone]
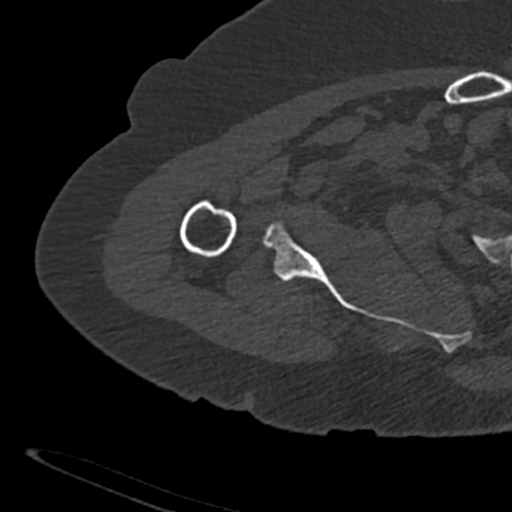
[im 222/317  bone]
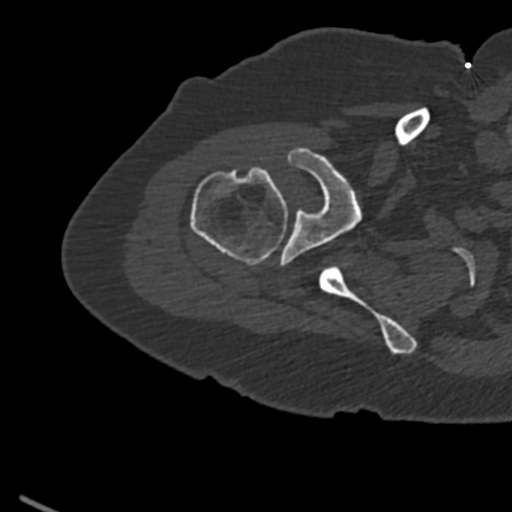
[im 253/317  bone]
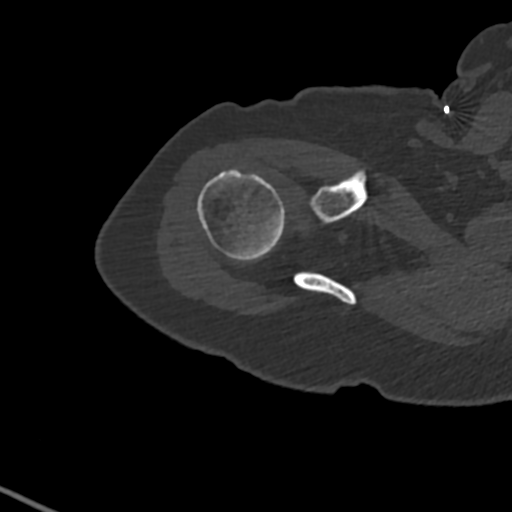
[im 285/317  soft-tissue]
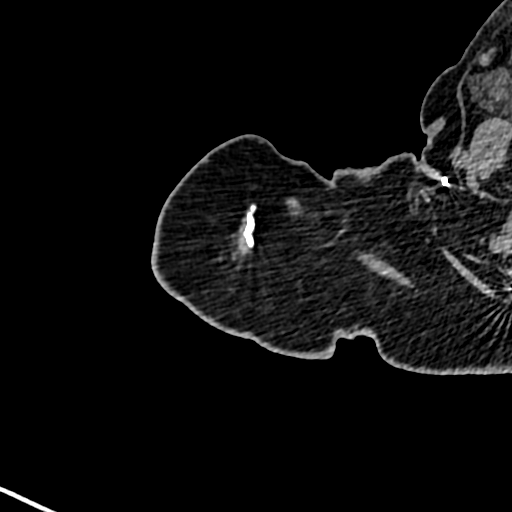
[im 285/317  bone]
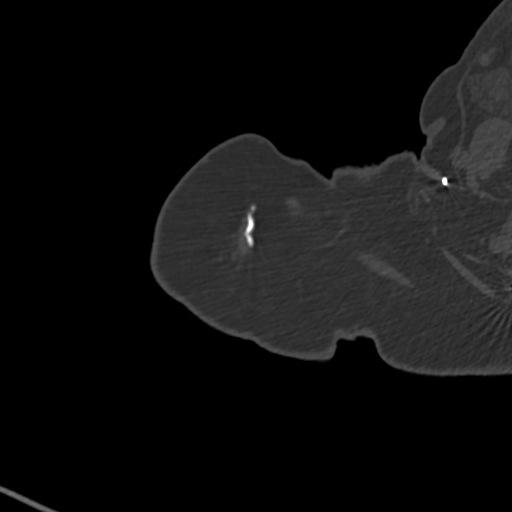

[12 of 20 positions shown; findings below may reference images not displayed]

FINDINGS: Bones/Joint/Cartilage

No fracture or dislocation. High-riding humeral head. No joint
effusion.

Mild osteoarthritis of the glenohumeral joint with joint space
narrowing and marginal osteophytosis.

Minimal arthropathy of the acromioclavicular joint. Type II
acromion.

Ligaments

Ligaments are suboptimally evaluated by CT.

Muscles and Tendons
Moderate infraspinatus muscle atrophy.

Soft tissue
No fluid collection or hematoma. No soft tissue mass. Scarring in
the anterior right upper lobe.
IMPRESSION: 1. Mild glenohumeral osteoarthritis.
2. Secondary signs of full-thickness rotator cuff tear with
high-riding humeral head and moderate infraspinatus muscle atrophy,
better evaluated on prior MRI.

## 2021-10-15 ENCOUNTER — Ambulatory Visit: Payer: Medicare HMO | Admitting: Orthopedic Surgery

## 2021-10-15 ENCOUNTER — Encounter: Payer: Self-pay | Admitting: Orthopedic Surgery

## 2021-10-15 ENCOUNTER — Other Ambulatory Visit: Payer: Self-pay

## 2021-10-15 DIAGNOSIS — Z96611 Presence of right artificial shoulder joint: Secondary | ICD-10-CM | POA: Diagnosis not present

## 2021-10-15 NOTE — Progress Notes (Signed)
Office Visit Note   Patient: [name]           Date of Birth: 07-26-1951           MRN: [id number] Visit Date: 10/15/2021 Requested by: Caryl Bis, MD [place],  [place] [id number] PCP: Caryl Bis, MD  Subjective: Chief Complaint  Patient presents with   Other    Check knot on right shoulder    HPI: [name] is a 71 year old patient underwent right reverse shoulder replacement about a year ago.  She is concerned about a nodule near the incision which came up a month ago.  Denies any fevers or chills or drainage.  Overall his shoulder is doing well and she is happy with her end result.              ROS: All systems reviewed are negative as they relate to the chief complaint within the history of present illness.  Patient denies  fevers or chills.   Assessment & Plan: Visit Diagnoses:  1. History of arthroplasty of right shoulder     Plan: Impression is partially thrombosed vein around the incision.  No swelling or edema in the right arm.  Shoulder function is excellent in terms of range of motion and strength.  Ultrasound examination confirms findings.  No evidence of infection.  I think this requires observation only and follow-up if symptoms worsen or if this vein enlarges.  Follow-Up Instructions: Return if symptoms worsen or fail to improve.   Orders:  No orders of the defined types were placed in this encounter.  No orders of the defined types were placed in this encounter.     Procedures: No procedures performed   Clinical Data: No additional findings.  Objective: Vital Signs: LMP  (LMP Unknown)   Physical Exam:   Constitutional: Patient appears well-developed HEENT:  Head: Normocephalic Eyes:EOM are normal Neck: Normal range of motion Cardiovascular: Normal rate Pulmonary/chest: Effort normal Neurologic: Patient is alert Skin: Skin is warm Psychiatric: Patient has normal mood and affect   Ortho Exam: Ortho exam demonstrates  excellent range of motion of the right shoulder above 90 degrees of abduction and forward flexion.  Strength is good to internal and external rotation.  Incision is palpated.  No fluctuance erythema induration or warmth.  She has a 1 x 1 cm nodule which under ultrasound evaluation appears to be a partially thrombosed vein.  No swelling or edema in the right arm itself.  Specialty Comments:  No specialty comments available.  Imaging: No results found.   PMFS History: Patient Active Problem List   Diagnosis Date Noted   Arthritis of right shoulder region    Complete tear of right rotator cuff 06/06/2020   S/P total knee replacement using cement, right 09/22/2016   Unilateral primary osteoarthritis, right knee 09/09/2016   Osteoarthritis of left knee 09/20/2013   Obesity, unspecified 09/20/2013   S/P total knee replacement using cement 09/19/2013   Past Medical History:  Diagnosis Date   Allergy    seasonal   Arthritis    Cancer ([place])    melanoma back   Complication of anesthesia 2015   ? bp- passed out after in room. / after knee replacement   Hypertension    Numbness and tingling of foot    right foot   Osteopenia    PONV (postoperative nausea and vomiting)     Family History  Problem Relation Age of Onset   Heart disease Mother  Lung cancer Father    Lung cancer Sister    Heart failure Brother    Esophageal cancer Paternal Grandfather    Heart disease Sister    Pancreatic cancer Paternal Grandmother     Past Surgical History:  Procedure Laterality Date   APPENDECTOMY  92   CHOLECYSTECTOMY     HAND SURGERY Right 06   foreign object   KNEE ARTHROSCOPY Left 82,84   left knee   KNEE ARTHROSCOPY Right 2010   KNEE SURGERY Left 1983   ligament   REVERSE SHOULDER ARTHROPLASTY Right 10/10/2020   Procedure: RIGHT REVERSE SHOULDER ARTHROPLASTY;  Surgeon: Meredith Pel, MD;  Location: [facility];  Service: Orthopedics;  Laterality: Right;   TOTAL KNEE ARTHROPLASTY Left  09/19/2013   Procedure: TOTAL KNEE ARTHROPLASTY;  Surgeon: Garald Balding, MD;  Location: [facility];  Service: Orthopedics;  Laterality: Left;   TOTAL KNEE ARTHROPLASTY Right 09/22/2016   Procedure: RIGHT TOTAL KNEE ARTHROPLASTY;  Surgeon: Garald Balding, MD;  Location: [facility];  Service: Orthopedics;  Laterality: Right;   TUBAL LIGATION  75   Social History   Occupational History   Not on file  Tobacco Use   Smoking status: Former    Packs/day: 2.00    Years: 20.00    Pack years: 40.00    Types: Cigarettes    Quit date: 07/10/1993    Years since quitting: 28.2   Smokeless tobacco: Never  Vaping Use   Vaping Use: Never used  Substance and Sexual Activity   Alcohol use: Yes    Comment: occ wine   Drug use: No   Sexual activity: Yes    Birth control/protection: Post-menopausal

## 2021-11-05 DIAGNOSIS — D239 Other benign neoplasm of skin, unspecified: Secondary | ICD-10-CM | POA: Diagnosis not present

## 2021-11-05 DIAGNOSIS — L570 Actinic keratosis: Secondary | ICD-10-CM | POA: Diagnosis not present

## 2021-11-05 DIAGNOSIS — Z1283 Encounter for screening for malignant neoplasm of skin: Secondary | ICD-10-CM | POA: Diagnosis not present

## 2022-02-12 ENCOUNTER — Encounter: Payer: Self-pay | Admitting: Orthopedic Surgery

## 2022-02-12 ENCOUNTER — Ambulatory Visit: Payer: Medicare HMO | Admitting: Orthopedic Surgery

## 2022-02-12 DIAGNOSIS — I10 Essential (primary) hypertension: Secondary | ICD-10-CM | POA: Diagnosis not present

## 2022-02-12 DIAGNOSIS — Z6830 Body mass index (BMI) 30.0-30.9, adult: Secondary | ICD-10-CM | POA: Diagnosis not present

## 2022-02-12 DIAGNOSIS — Z96611 Presence of right artificial shoulder joint: Secondary | ICD-10-CM

## 2022-02-12 DIAGNOSIS — L989 Disorder of the skin and subcutaneous tissue, unspecified: Secondary | ICD-10-CM | POA: Diagnosis not present

## 2022-02-12 DIAGNOSIS — D179 Benign lipomatous neoplasm, unspecified: Secondary | ICD-10-CM | POA: Diagnosis not present

## 2022-02-12 NOTE — Progress Notes (Signed)
Office Visit Note   Patient: [name]           Date of Birth: 07/17/1951           MRN: [id number] Visit Date: 02/12/2022 Requested by: Caryl Bis, MD [place],  [place] [id number] PCP: Caryl Bis, MD  Subjective: Chief Complaint  Patient presents with   Right Shoulder - Follow-up    HPI: [name] is a 71 year old patient who is now 15 months out right reverse shoulder replacement.  Doing well.  No pain no fevers or chills.  She is really able to do what she wants to do.  She has been doing a lot of traveling.  Going to [place] in [place].  She is able to sleep on the right-hand side.              ROS: All systems reviewed are negative as they relate to the chief complaint within the history of present illness.  Patient denies  fevers or chills.   Assessment & Plan: Visit Diagnoses: No diagnosis found.  Plan: Impression is patient is doing well following right reverse shoulder replacement.  Patient has forward flexion and abduction both above 90 degrees.  She is able to travel and sleep on the right-hand side.  Plan at this time is to continue with activity as tolerated.  No concern about the small little nodule around the incision.  Follow-up as needed.  Follow-Up Instructions: Return if symptoms worsen or fail to improve.   Orders:  No orders of the defined types were placed in this encounter.  No orders of the defined types were placed in this encounter.     Procedures: No procedures performed   Clinical Data: No additional findings.  Objective: Vital Signs: LMP  (LMP Unknown)   Physical Exam:   Constitutional: Patient appears well-developed HEENT:  Head: Normocephalic Eyes:EOM are normal Neck: Normal range of motion Cardiovascular: Normal rate Pulmonary/chest: Effort normal Neurologic: Patient is alert Skin: Skin is warm Psychiatric: Patient has normal mood and affect   Ortho Exam: Ortho exam demonstrates full active and  passive range of motion of the elbow and wrist.  Forward flexion and abduction both easily above 90 degrees on the right-hand side with good rotator cuff strength and abduction strength.  Incision intact.  1 x 1 cm nodule in the middle of the incision which feels like it may be calcified fatty tissue versus some type of cystic structure below the incision.  No warmth drainage induration or erythema around the incision and no lymphadenopathy present  Specialty Comments:  No specialty comments available.  Imaging: No results found.   PMFS History: Patient Active Problem List   Diagnosis Date Noted   Arthritis of right shoulder region    Complete tear of right rotator cuff 06/06/2020   S/P total knee replacement using cement, right 09/22/2016   Unilateral primary osteoarthritis, right knee 09/09/2016   Osteoarthritis of left knee 09/20/2013   Obesity, unspecified 09/20/2013   S/P total knee replacement using cement 09/19/2013   Past Medical History:  Diagnosis Date   Allergy    seasonal   Arthritis    Cancer ([facility])    melanoma back   Complication of anesthesia 2015   ? bp- passed out after in room. / after knee replacement   Hypertension    Numbness and tingling of foot    right foot   Osteopenia    PONV (postoperative nausea and vomiting)  Family History  Problem Relation Age of Onset   Heart disease Mother    Lung cancer Father    Lung cancer Sister    Heart failure Brother    Esophageal cancer Paternal Grandfather    Heart disease Sister    Pancreatic cancer Paternal Grandmother     Past Surgical History:  Procedure Laterality Date   APPENDECTOMY  86   CHOLECYSTECTOMY     HAND SURGERY Right 06   foreign object   KNEE ARTHROSCOPY Left 82,84   left knee   KNEE ARTHROSCOPY Right 2010   KNEE SURGERY Left 1983   ligament   REVERSE SHOULDER ARTHROPLASTY Right 10/10/2020   Procedure: RIGHT REVERSE SHOULDER ARTHROPLASTY;  Surgeon: Meredith Pel, MD;  Location:  [facility];  Service: Orthopedics;  Laterality: Right;   TOTAL KNEE ARTHROPLASTY Left 09/19/2013   Procedure: TOTAL KNEE ARTHROPLASTY;  Surgeon: Garald Balding, MD;  Location: [facility];  Service: Orthopedics;  Laterality: Left;   TOTAL KNEE ARTHROPLASTY Right 09/22/2016   Procedure: RIGHT TOTAL KNEE ARTHROPLASTY;  Surgeon: Garald Balding, MD;  Location: [facility];  Service: Orthopedics;  Laterality: Right;   TUBAL LIGATION  75   Social History   Occupational History   Not on file  Tobacco Use   Smoking status: Former    Packs/day: 2.00    Years: 20.00    Total pack years: 40.00    Types: Cigarettes    Quit date: 07/10/1993    Years since quitting: 28.6   Smokeless tobacco: Never  Vaping Use   Vaping Use: Never used  Substance and Sexual Activity   Alcohol use: Yes    Comment: occ wine   Drug use: No   Sexual activity: Yes    Birth control/protection: Post-menopausal

## 2022-03-31 DIAGNOSIS — D3132 Benign neoplasm of left choroid: Secondary | ICD-10-CM | POA: Diagnosis not present

## 2022-04-20 ENCOUNTER — Other Ambulatory Visit (HOSPITAL_COMMUNITY): Payer: Self-pay | Admitting: Family Medicine

## 2022-04-20 DIAGNOSIS — Z1231 Encounter for screening mammogram for malignant neoplasm of breast: Secondary | ICD-10-CM

## 2022-05-14 ENCOUNTER — Ambulatory Visit (HOSPITAL_COMMUNITY)
Admission: RE | Admit: 2022-05-14 | Discharge: 2022-05-14 | Disposition: A | Discharge status: Home / Self Care | Payer: Medicare HMO | Source: Ambulatory Visit | Attending: Family Medicine | Admitting: Family Medicine

## 2022-05-14 DIAGNOSIS — Z1231 Encounter for screening mammogram for malignant neoplasm of breast: Secondary | ICD-10-CM | POA: Diagnosis not present

## 2022-05-25 DIAGNOSIS — I10 Essential (primary) hypertension: Secondary | ICD-10-CM | POA: Diagnosis not present

## 2022-05-25 DIAGNOSIS — Z1329 Encounter for screening for other suspected endocrine disorder: Secondary | ICD-10-CM | POA: Diagnosis not present

## 2022-05-25 DIAGNOSIS — R5383 Other fatigue: Secondary | ICD-10-CM | POA: Diagnosis not present

## 2022-05-25 DIAGNOSIS — N1831 Chronic kidney disease, stage 3a: Secondary | ICD-10-CM | POA: Diagnosis not present

## 2022-05-25 DIAGNOSIS — E7849 Other hyperlipidemia: Secondary | ICD-10-CM | POA: Diagnosis not present

## 2022-05-28 ENCOUNTER — Other Ambulatory Visit (HOSPITAL_COMMUNITY): Payer: Self-pay | Admitting: Family Medicine

## 2022-05-28 DIAGNOSIS — Z23 Encounter for immunization: Secondary | ICD-10-CM | POA: Diagnosis not present

## 2022-05-28 DIAGNOSIS — J301 Allergic rhinitis due to pollen: Secondary | ICD-10-CM | POA: Diagnosis not present

## 2022-05-28 DIAGNOSIS — R4582 Worries: Secondary | ICD-10-CM | POA: Diagnosis not present

## 2022-05-28 DIAGNOSIS — M19011 Primary osteoarthritis, right shoulder: Secondary | ICD-10-CM | POA: Diagnosis not present

## 2022-05-28 DIAGNOSIS — E7849 Other hyperlipidemia: Secondary | ICD-10-CM | POA: Diagnosis not present

## 2022-05-28 DIAGNOSIS — N1831 Chronic kidney disease, stage 3a: Secondary | ICD-10-CM | POA: Diagnosis not present

## 2022-05-28 DIAGNOSIS — I10 Essential (primary) hypertension: Secondary | ICD-10-CM | POA: Diagnosis not present

## 2022-05-28 DIAGNOSIS — Z0001 Encounter for general adult medical examination with abnormal findings: Secondary | ICD-10-CM | POA: Diagnosis not present

## 2022-05-28 DIAGNOSIS — Z6829 Body mass index (BMI) 29.0-29.9, adult: Secondary | ICD-10-CM | POA: Diagnosis not present

## 2022-05-28 DIAGNOSIS — R69 Illness, unspecified: Secondary | ICD-10-CM | POA: Diagnosis not present

## 2022-05-28 DIAGNOSIS — M810 Age-related osteoporosis without current pathological fracture: Secondary | ICD-10-CM

## 2022-05-28 DIAGNOSIS — M1711 Unilateral primary osteoarthritis, right knee: Secondary | ICD-10-CM | POA: Diagnosis not present

## 2022-06-03 ENCOUNTER — Ambulatory Visit (HOSPITAL_COMMUNITY)
Admission: RE | Admit: 2022-06-03 | Discharge: 2022-06-03 | Disposition: A | Discharge status: Home / Self Care | Payer: Medicare HMO | Source: Ambulatory Visit | Attending: Family Medicine | Admitting: Family Medicine

## 2022-06-03 DIAGNOSIS — M85832 Other specified disorders of bone density and structure, left forearm: Secondary | ICD-10-CM | POA: Diagnosis not present

## 2022-06-03 DIAGNOSIS — M8589 Other specified disorders of bone density and structure, multiple sites: Secondary | ICD-10-CM | POA: Diagnosis not present

## 2022-06-03 DIAGNOSIS — M810 Age-related osteoporosis without current pathological fracture: Secondary | ICD-10-CM | POA: Insufficient documentation

## 2022-06-03 DIAGNOSIS — M85852 Other specified disorders of bone density and structure, left thigh: Secondary | ICD-10-CM | POA: Diagnosis not present

## 2022-06-24 DIAGNOSIS — Z23 Encounter for immunization: Secondary | ICD-10-CM | POA: Diagnosis not present

## 2022-11-09 DIAGNOSIS — Z85820 Personal history of malignant melanoma of skin: Secondary | ICD-10-CM | POA: Diagnosis not present

## 2022-11-09 DIAGNOSIS — Z1283 Encounter for screening for malignant neoplasm of skin: Secondary | ICD-10-CM | POA: Diagnosis not present

## 2022-11-09 DIAGNOSIS — D239 Other benign neoplasm of skin, unspecified: Secondary | ICD-10-CM | POA: Diagnosis not present

## 2022-11-09 DIAGNOSIS — L570 Actinic keratosis: Secondary | ICD-10-CM | POA: Diagnosis not present

## 2022-11-19 DIAGNOSIS — E7849 Other hyperlipidemia: Secondary | ICD-10-CM | POA: Diagnosis not present

## 2022-11-19 DIAGNOSIS — Z1329 Encounter for screening for other suspected endocrine disorder: Secondary | ICD-10-CM | POA: Diagnosis not present

## 2022-11-19 DIAGNOSIS — R5383 Other fatigue: Secondary | ICD-10-CM | POA: Diagnosis not present

## 2022-11-19 DIAGNOSIS — I10 Essential (primary) hypertension: Secondary | ICD-10-CM | POA: Diagnosis not present

## 2022-11-26 DIAGNOSIS — R4582 Worries: Secondary | ICD-10-CM | POA: Diagnosis not present

## 2022-11-26 DIAGNOSIS — Z1212 Encounter for screening for malignant neoplasm of rectum: Secondary | ICD-10-CM | POA: Diagnosis not present

## 2022-11-26 DIAGNOSIS — R2231 Localized swelling, mass and lump, right upper limb: Secondary | ICD-10-CM | POA: Diagnosis not present

## 2022-11-26 DIAGNOSIS — Z6828 Body mass index (BMI) 28.0-28.9, adult: Secondary | ICD-10-CM | POA: Diagnosis not present

## 2022-11-26 DIAGNOSIS — J301 Allergic rhinitis due to pollen: Secondary | ICD-10-CM | POA: Diagnosis not present

## 2022-11-26 DIAGNOSIS — I10 Essential (primary) hypertension: Secondary | ICD-10-CM | POA: Diagnosis not present

## 2022-11-26 DIAGNOSIS — M1711 Unilateral primary osteoarthritis, right knee: Secondary | ICD-10-CM | POA: Diagnosis not present

## 2022-11-26 DIAGNOSIS — Z23 Encounter for immunization: Secondary | ICD-10-CM | POA: Diagnosis not present

## 2022-11-26 DIAGNOSIS — R69 Illness, unspecified: Secondary | ICD-10-CM | POA: Diagnosis not present

## 2022-11-26 DIAGNOSIS — M19011 Primary osteoarthritis, right shoulder: Secondary | ICD-10-CM | POA: Diagnosis not present

## 2022-11-26 DIAGNOSIS — E7849 Other hyperlipidemia: Secondary | ICD-10-CM | POA: Diagnosis not present

## 2022-11-26 DIAGNOSIS — N1831 Chronic kidney disease, stage 3a: Secondary | ICD-10-CM | POA: Diagnosis not present

## 2022-12-03 DIAGNOSIS — R2231 Localized swelling, mass and lump, right upper limb: Secondary | ICD-10-CM | POA: Diagnosis not present

## 2023-04-05 DIAGNOSIS — H5203 Hypermetropia, bilateral: Secondary | ICD-10-CM | POA: Diagnosis not present

## 2023-04-05 DIAGNOSIS — R69 Illness, unspecified: Secondary | ICD-10-CM | POA: Diagnosis not present

## 2023-04-16 DIAGNOSIS — H52223 Regular astigmatism, bilateral: Secondary | ICD-10-CM | POA: Diagnosis not present

## 2023-04-16 DIAGNOSIS — Z0100 Encounter for examination of eyes and vision without abnormal findings: Secondary | ICD-10-CM | POA: Diagnosis not present

## 2023-04-16 DIAGNOSIS — H524 Presbyopia: Secondary | ICD-10-CM | POA: Diagnosis not present

## 2023-04-22 ENCOUNTER — Other Ambulatory Visit (HOSPITAL_COMMUNITY): Payer: Self-pay | Admitting: Family Medicine

## 2023-04-22 DIAGNOSIS — Z1231 Encounter for screening mammogram for malignant neoplasm of breast: Secondary | ICD-10-CM

## 2023-05-17 ENCOUNTER — Encounter (HOSPITAL_COMMUNITY): Payer: Self-pay

## 2023-05-17 ENCOUNTER — Ambulatory Visit (HOSPITAL_COMMUNITY)
Admission: RE | Admit: 2023-05-17 | Discharge: 2023-05-17 | Disposition: A | Discharge status: Home / Self Care | Payer: Medicare HMO | Source: Ambulatory Visit | Attending: Family Medicine | Admitting: Family Medicine

## 2023-05-17 DIAGNOSIS — Z1231 Encounter for screening mammogram for malignant neoplasm of breast: Secondary | ICD-10-CM | POA: Diagnosis not present

## 2023-05-26 DIAGNOSIS — Z1329 Encounter for screening for other suspected endocrine disorder: Secondary | ICD-10-CM | POA: Diagnosis not present

## 2023-05-26 DIAGNOSIS — N1832 Chronic kidney disease, stage 3b: Secondary | ICD-10-CM | POA: Diagnosis not present

## 2023-05-26 DIAGNOSIS — E7849 Other hyperlipidemia: Secondary | ICD-10-CM | POA: Diagnosis not present

## 2023-05-26 DIAGNOSIS — Z1321 Encounter for screening for nutritional disorder: Secondary | ICD-10-CM | POA: Diagnosis not present

## 2023-05-26 DIAGNOSIS — Z0000 Encounter for general adult medical examination without abnormal findings: Secondary | ICD-10-CM | POA: Diagnosis not present

## 2023-06-01 DIAGNOSIS — Z6830 Body mass index (BMI) 30.0-30.9, adult: Secondary | ICD-10-CM | POA: Diagnosis not present

## 2023-06-01 DIAGNOSIS — M19011 Primary osteoarthritis, right shoulder: Secondary | ICD-10-CM | POA: Diagnosis not present

## 2023-06-01 DIAGNOSIS — R4582 Worries: Secondary | ICD-10-CM | POA: Diagnosis not present

## 2023-06-01 DIAGNOSIS — Z23 Encounter for immunization: Secondary | ICD-10-CM | POA: Diagnosis not present

## 2023-06-01 DIAGNOSIS — M1711 Unilateral primary osteoarthritis, right knee: Secondary | ICD-10-CM | POA: Diagnosis not present

## 2023-06-01 DIAGNOSIS — E7849 Other hyperlipidemia: Secondary | ICD-10-CM | POA: Diagnosis not present

## 2023-06-01 DIAGNOSIS — R2231 Localized swelling, mass and lump, right upper limb: Secondary | ICD-10-CM | POA: Diagnosis not present

## 2023-06-01 DIAGNOSIS — N1831 Chronic kidney disease, stage 3a: Secondary | ICD-10-CM | POA: Diagnosis not present

## 2023-06-01 DIAGNOSIS — M545 Low back pain, unspecified: Secondary | ICD-10-CM | POA: Diagnosis not present

## 2023-06-01 DIAGNOSIS — R0789 Other chest pain: Secondary | ICD-10-CM | POA: Diagnosis not present

## 2023-06-01 DIAGNOSIS — I10 Essential (primary) hypertension: Secondary | ICD-10-CM | POA: Diagnosis not present

## 2023-06-01 DIAGNOSIS — J301 Allergic rhinitis due to pollen: Secondary | ICD-10-CM | POA: Diagnosis not present

## 2023-06-16 DIAGNOSIS — Z885 Allergy status to narcotic agent status: Secondary | ICD-10-CM | POA: Diagnosis not present

## 2023-06-16 DIAGNOSIS — M8580 Other specified disorders of bone density and structure, unspecified site: Secondary | ICD-10-CM | POA: Diagnosis not present

## 2023-06-16 DIAGNOSIS — J301 Allergic rhinitis due to pollen: Secondary | ICD-10-CM | POA: Diagnosis not present

## 2023-06-16 DIAGNOSIS — Z833 Family history of diabetes mellitus: Secondary | ICD-10-CM | POA: Diagnosis not present

## 2023-06-16 DIAGNOSIS — G629 Polyneuropathy, unspecified: Secondary | ICD-10-CM | POA: Diagnosis not present

## 2023-06-16 DIAGNOSIS — Z008 Encounter for other general examination: Secondary | ICD-10-CM | POA: Diagnosis not present

## 2023-06-16 DIAGNOSIS — Z973 Presence of spectacles and contact lenses: Secondary | ICD-10-CM | POA: Diagnosis not present

## 2023-06-16 DIAGNOSIS — Z809 Family history of malignant neoplasm, unspecified: Secondary | ICD-10-CM | POA: Diagnosis not present

## 2023-06-16 DIAGNOSIS — Z85828 Personal history of other malignant neoplasm of skin: Secondary | ICD-10-CM | POA: Diagnosis not present

## 2023-06-16 DIAGNOSIS — Z9181 History of falling: Secondary | ICD-10-CM | POA: Diagnosis not present

## 2023-06-16 DIAGNOSIS — N189 Chronic kidney disease, unspecified: Secondary | ICD-10-CM | POA: Diagnosis not present

## 2023-06-16 DIAGNOSIS — Z720 Tobacco use: Secondary | ICD-10-CM | POA: Diagnosis not present

## 2023-06-16 DIAGNOSIS — Z8249 Family history of ischemic heart disease and other diseases of the circulatory system: Secondary | ICD-10-CM | POA: Diagnosis not present

## 2023-07-01 DIAGNOSIS — H01005 Unspecified blepharitis left lower eyelid: Secondary | ICD-10-CM | POA: Diagnosis not present

## 2023-07-01 DIAGNOSIS — H01004 Unspecified blepharitis left upper eyelid: Secondary | ICD-10-CM | POA: Diagnosis not present

## 2023-07-01 DIAGNOSIS — H25813 Combined forms of age-related cataract, bilateral: Secondary | ICD-10-CM | POA: Diagnosis not present

## 2023-07-01 DIAGNOSIS — H01002 Unspecified blepharitis right lower eyelid: Secondary | ICD-10-CM | POA: Diagnosis not present

## 2023-07-12 DIAGNOSIS — H25811 Combined forms of age-related cataract, right eye: Secondary | ICD-10-CM | POA: Diagnosis not present

## 2023-07-14 ENCOUNTER — Encounter (HOSPITAL_COMMUNITY)
Admission: RE | Admit: 2023-07-14 | Discharge: 2023-07-14 | Disposition: A | Discharge status: Home / Self Care | Payer: Medicare HMO | Source: Ambulatory Visit | Attending: Ophthalmology | Admitting: Ophthalmology

## 2023-07-14 ENCOUNTER — Encounter (HOSPITAL_COMMUNITY): Payer: Self-pay

## 2023-07-16 NOTE — H&P (Signed)
Surgical History & Physical  Patient Name: [name]  DOB: 1950-11-28  Surgery: Cataract extraction with intraocular lens implant phacoemulsification; Right Eye Surgeon: Fabio Pierce MD Surgery Date: 07/19/2023 Pre-Op Date: 07/01/2023  HPI: A 72 Yr. old female patient present for cataract eval per Dr. Charise Killian. 1. The patient complains of difficulty when driving during the day due to glare, seeing captions on the TV, and reading fine print, which began 1 year ago. Both eyes are affected, OD>OS. The symptoms have been gradual. The condition's severity is worsening. This is negatively affecting the patient's quality of life and the patient is unable to function adequately in life with the current level of vision. Patient started using Systane prn. HPI was performed by Fabio Pierce .  Medical History: Dry Eyes Cataracts  High Blood Pressure Allergies  Review of Systems Cardiovascular High Blood Pressure All recorded systems are negative except as noted above.  Social Never smoked   Medication olmesartan ,  amlodipine ,  metoprolol succinate ,  prednisolone acetate ,  Prolensa ,  moxifloxacin   Sx/Procedures Knee sx bilateral, Shoulder Replacement, Appendectomy, Tonsillectomy  Drug Allergies  hydrocodone   History & Physical: Heent: cataracts NECK: supple without bruits LUNGS: lungs clear to auscultation CV: regular rate and rhythm Abdomen: soft and non-tender  Impression & Plan: Assessment: 1.  COMBINED FORMS AGE RELATED CATARACT; Both Eyes (H25.813) 2.  ASTIGMATISM, REGULAR; Both Eyes (H52.223) 3.  BLEPHARITIS; Right Lower Lid, Left Upper Lid, Left Lower Lid (H01.002, H01.004,H01.005) 4.  DERMATOCHALASIS, no surgery; Right Upper Lid, Left Upper Lid (H02.831, H02.834) 5.  Pinguecula; Both Eyes (H11.153) 6.  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  Cataract accounts for the patient's decreased vision. This visual impairment is not  correctable with a tolerable change in glasses or contact lenses. Cataract surgery with an implantation of a new lens should significantly improve the visual and functional status of the patient. Discussed all risks, benefits, alternatives, and potential complications. Discussed the procedures and recovery. Patient desires to have surgery. A-scan ordered and performed today for intra-ocular lens calculations. The surgery will be performed in order to improve vision for driving, reading, and for eye examinations. Recommend phacoemulsification with intra-ocular lens. Recommend Dextenza for post-operative pain and inflammation. Right Eye worse - first. Dilates well - shugarcaine by protocol.  2.  with ABMD. Standard IOL.  3.  Blepharitis is present - recommend regular lid cleaning.  4.  Likely visually significant.  5.  Observe; Artificial tears as needed for irritation.  6.  Discussed condition with patient. Artificial tears 1 drop 2-3x/day.

## 2023-07-19 ENCOUNTER — Ambulatory Visit (HOSPITAL_COMMUNITY): Payer: Medicare HMO | Admitting: Anesthesiology

## 2023-07-19 ENCOUNTER — Ambulatory Visit (HOSPITAL_COMMUNITY)
Admission: RE | Admit: 2023-07-19 | Discharge: 2023-07-19 | Disposition: A | Discharge status: Home / Self Care | Payer: Medicare HMO | Attending: Ophthalmology | Admitting: Ophthalmology

## 2023-07-19 ENCOUNTER — Encounter (HOSPITAL_COMMUNITY)
Admission: RE | Disposition: A | Discharge status: Home / Self Care | Payer: Self-pay | Source: Home / Self Care | Attending: Ophthalmology

## 2023-07-19 DIAGNOSIS — H25811 Combined forms of age-related cataract, right eye: Secondary | ICD-10-CM | POA: Insufficient documentation

## 2023-07-19 DIAGNOSIS — H11153 Pinguecula, bilateral: Secondary | ICD-10-CM | POA: Diagnosis not present

## 2023-07-19 DIAGNOSIS — H02831 Dermatochalasis of right upper eyelid: Secondary | ICD-10-CM | POA: Insufficient documentation

## 2023-07-19 DIAGNOSIS — H02834 Dermatochalasis of left upper eyelid: Secondary | ICD-10-CM | POA: Diagnosis not present

## 2023-07-19 DIAGNOSIS — H52203 Unspecified astigmatism, bilateral: Secondary | ICD-10-CM | POA: Insufficient documentation

## 2023-07-19 DIAGNOSIS — H18593 Other hereditary corneal dystrophies, bilateral: Secondary | ICD-10-CM | POA: Insufficient documentation

## 2023-07-19 DIAGNOSIS — Z87891 Personal history of nicotine dependence: Secondary | ICD-10-CM | POA: Insufficient documentation

## 2023-07-19 DIAGNOSIS — I10 Essential (primary) hypertension: Secondary | ICD-10-CM | POA: Insufficient documentation

## 2023-07-19 DIAGNOSIS — H0100B Unspecified blepharitis left eye, upper and lower eyelids: Secondary | ICD-10-CM | POA: Insufficient documentation

## 2023-07-19 DIAGNOSIS — H0100A Unspecified blepharitis right eye, upper and lower eyelids: Secondary | ICD-10-CM | POA: Diagnosis not present

## 2023-07-19 HISTORY — PX: CATARACT EXTRACTION W/PHACO: SHX586

## 2023-07-19 SURGERY — PHACOEMULSIFICATION, CATARACT, WITH IOL INSERTION
Anesthesia: Monitor Anesthesia Care | Site: Eye | Laterality: Right

## 2023-07-19 MED ORDER — POVIDONE-IODINE 5 % OP SOLN
OPHTHALMIC | Status: DC | PRN
  Administered 2023-07-19: 1 via OPHTHALMIC

## 2023-07-19 MED ORDER — STERILE WATER FOR IRRIGATION IR SOLN
Status: DC | PRN
  Administered 2023-07-19: 1

## 2023-07-19 MED ORDER — SODIUM HYALURONATE 23MG/ML IO SOSY
PREFILLED_SYRINGE | INTRAOCULAR | Status: DC | PRN
  Administered 2023-07-19: .6 mL via INTRAOCULAR

## 2023-07-19 MED ORDER — TROPICAMIDE 1 % OP SOLN
1.0000 [drp] | OPHTHALMIC | Status: AC | PRN
  Administered 2023-07-19 (×3): 1 [drp] via OPHTHALMIC

## 2023-07-19 MED ORDER — LIDOCAINE HCL (PF) 1 % IJ SOLN
INTRAOCULAR | Status: DC | PRN
  Administered 2023-07-19: 1 mL via OPHTHALMIC

## 2023-07-19 MED ORDER — SODIUM HYALURONATE 10 MG/ML IO SOLUTION
PREFILLED_SYRINGE | INTRAOCULAR | Status: DC | PRN
  Administered 2023-07-19: .85 mL via INTRAOCULAR

## 2023-07-19 MED ORDER — PHENYLEPHRINE HCL 2.5 % OP SOLN
1.0000 [drp] | OPHTHALMIC | Status: AC | PRN
  Administered 2023-07-19 (×3): 1 [drp] via OPHTHALMIC

## 2023-07-19 MED ORDER — BSS IO SOLN
INTRAOCULAR | Status: DC | PRN
  Administered 2023-07-19: 15 mL via INTRAOCULAR

## 2023-07-19 MED ORDER — MIDAZOLAM HCL 2 MG/2ML IJ SOLN
INTRAMUSCULAR | Status: AC
  Filled 2023-07-19: qty 2

## 2023-07-19 MED ORDER — SODIUM CHLORIDE 0.9% FLUSH
INTRAVENOUS | Status: DC | PRN
  Administered 2023-07-19: 5 mL via INTRAVENOUS
  Administered 2023-07-19: 3 mL via INTRAVENOUS

## 2023-07-19 MED ORDER — TETRACAINE HCL 0.5 % OP SOLN
1.0000 [drp] | OPHTHALMIC | Status: AC | PRN
  Administered 2023-07-19 (×3): 1 [drp] via OPHTHALMIC

## 2023-07-19 MED ORDER — MOXIFLOXACIN HCL 5 MG/ML IO SOLN
INTRAOCULAR | Status: DC | PRN
  Administered 2023-07-19: .3 mL via INTRACAMERAL

## 2023-07-19 MED ORDER — LIDOCAINE HCL 3.5 % OP GEL
1.0000 | Freq: Once | OPHTHALMIC | Status: AC
  Administered 2023-07-19: 1 via OPHTHALMIC

## 2023-07-19 MED ORDER — FENTANYL CITRATE (PF) 100 MCG/2ML IJ SOLN
INTRAMUSCULAR | Status: AC
  Filled 2023-07-19: qty 2

## 2023-07-19 MED ORDER — FENTANYL CITRATE (PF) 100 MCG/2ML IJ SOLN
INTRAMUSCULAR | Status: DC | PRN
  Administered 2023-07-19: 50 ug via INTRAVENOUS

## 2023-07-19 MED ORDER — ONDANSETRON HCL 4 MG/2ML IJ SOLN
INTRAMUSCULAR | Status: DC | PRN
  Administered 2023-07-19: 4 mg via INTRAVENOUS

## 2023-07-19 MED ORDER — MIDAZOLAM HCL 5 MG/5ML IJ SOLN
INTRAMUSCULAR | Status: DC | PRN
  Administered 2023-07-19: 1 mg via INTRAVENOUS

## 2023-07-19 MED ORDER — ONDANSETRON HCL 4 MG/2ML IJ SOLN
INTRAMUSCULAR | Status: AC
  Filled 2023-07-19: qty 2

## 2023-07-19 MED ORDER — EPINEPHRINE PF 1 MG/ML IJ SOLN
INTRAOCULAR | Status: DC | PRN
  Administered 2023-07-19: 500 mL

## 2023-07-19 SURGICAL SUPPLY — 13 items
CATARACT SUITE SIGHTPATH (MISCELLANEOUS) ×1
CLOTH BEACON ORANGE TIMEOUT ST (SAFETY) ×2 IMPLANT
EYE SHIELD UNIVERSAL CLEAR (GAUZE/BANDAGES/DRESSINGS) IMPLANT
FEE CATARACT SUITE SIGHTPATH (MISCELLANEOUS) ×2 IMPLANT
GLOVE BIOGEL PI IND STRL 7.0 (GLOVE) ×4 IMPLANT
LENS IOL TECNIS EYHANCE 22.0 (Intraocular Lens) IMPLANT
NDL HYPO 18GX1.5 BLUNT FILL (NEEDLE) ×2 IMPLANT
NEEDLE HYPO 18GX1.5 BLUNT FILL (NEEDLE) ×1
PAD ARMBOARD 7.5X6 YLW CONV (MISCELLANEOUS) ×2 IMPLANT
POSITIONER HEAD 8X9X4 ADT (SOFTGOODS) ×2 IMPLANT
SYR TB 1ML LL NO SAFETY (SYRINGE) ×2 IMPLANT
TAPE SURG TRANSPORE 1 IN (GAUZE/BANDAGES/DRESSINGS) IMPLANT
WATER STERILE IRR 250ML POUR (IV SOLUTION) ×2 IMPLANT

## 2023-07-19 NOTE — Anesthesia Postprocedure Evaluation (Signed)
Anesthesia Post Note  Patient: [name]  Procedure(s) Performed: CATARACT EXTRACTION PHACO AND INTRAOCULAR LENS PLACEMENT (IOC) (Right: Eye)  Patient location during evaluation: PACU Anesthesia Type: MAC Level of consciousness: awake and alert Pain management: pain level controlled Vital Signs Assessment: post-procedure vital signs reviewed and stable Respiratory status: spontaneous breathing, nonlabored ventilation, respiratory function stable and patient connected to nasal cannula oxygen Cardiovascular status: stable and blood pressure returned to baseline Postop Assessment: no apparent nausea or vomiting Anesthetic complications: no   There were no known notable events for this encounter.   Last Vitals:  Vitals:   07/19/23 0900 07/19/23 1000  BP:  137/87  Pulse: 61 68  Resp: 19 16  Temp:  36.7 C  SpO2: 96% 98%    Last Pain:  Vitals:   07/19/23 1000  TempSrc: Oral  PainSc: 0-No pain                 Seleen Walter L Opel Lejeune

## 2023-07-19 NOTE — Discharge Instructions (Addendum)
Please discharge patient when stable, will follow up today with Dr. Carnella Fryman at the [facility] office immediately following discharge.  Leave shield in place until visit.  All paperwork with discharge instructions will be given at the office.  [facility] Address:  [address]  [place], [place] [address]  

## 2023-07-19 NOTE — Anesthesia Preprocedure Evaluation (Addendum)
Anesthesia Evaluation  Patient identified by MRN, date of birth, ID band Patient awake    Reviewed: Allergy & Precautions, H&P , NPO status , Patient's Chart, lab work & pertinent test results, reviewed documented beta blocker date and time   History of Anesthesia Complications (+) PONV and history of anesthetic complications  Airway Mallampati: II  TM Distance: >3 FB Neck ROM: full    Dental  (+) Dental Advisory Given, Caps Upper front teeth capped:   Pulmonary former smoker   Pulmonary exam normal breath sounds clear to auscultation       Cardiovascular Exercise Tolerance: Good hypertension, Normal cardiovascular exam Rhythm:regular Rate:Normal     Neuro/Psych negative neurological ROS  negative psych ROS   GI/Hepatic negative GI ROS, Neg liver ROS,,,  Endo/Other  negative endocrine ROS    Renal/GU negative Renal ROS  negative genitourinary   Musculoskeletal   Abdominal   Peds  Hematology negative hematology ROS (+)   Anesthesia Other Findings   Reproductive/Obstetrics negative OB ROS                             Anesthesia Physical Anesthesia Plan  ASA: 2  Anesthesia Plan: MAC   Post-op Pain Management:    Induction:   PONV Risk Score and Plan:   Airway Management Planned: Nasal Cannula  Additional Equipment: None  Intra-op Plan:   Post-operative Plan:   Informed Consent: I have reviewed the patients History and Physical, chart, labs and discussed the procedure including the risks, benefits and alternatives for the proposed anesthesia with the patient or authorized representative who has indicated his/her understanding and acceptance.     Dental Advisory Given  Plan Discussed with: CRNA  Anesthesia Plan Comments:         Anesthesia Quick Evaluation

## 2023-07-19 NOTE — Op Note (Signed)
Date of procedure: 07/19/23  Pre-operative diagnosis:  Visually significant combined form age-related cataract, Right Eye (H25.811)  Post-operative diagnosis:  Visually significant combined form age-related cataract, Right Eye (H25.811)  Procedure: Removal of cataract via phacoemulsification and insertion of intra-ocular lens [employer] DIB00 +22.0D into the capsular bag of the Right Eye  Attending surgeon: Rudy Jew. Marolyn Urschel, MD, MA  Anesthesia: MAC, Topical [place]  Complications: None  Estimated Blood Loss: <59mL (minimal)  Specimens: None  Implants: As above  Indications:  Visually significant age-related cataract, Right Eye  Procedure:  The patient was seen and identified in the pre-operative area. The operative eye was identified and dilated.  The operative eye was marked.  Topical anesthesia was administered to the operative eye.     The patient was then to the operative suite and placed in the supine position.  A timeout was performed confirming the patient, procedure to be performed, and all other relevant information.   The patient's face was prepped and draped in the usual fashion for intra-ocular surgery.  A lid speculum was placed into the operative eye and the surgical microscope moved into place and focused.  A superotemporal paracentesis was created using a 20 gauge paracentesis blade.  Shugarcaine was injected into the anterior chamber.  Viscoelastic was injected into the anterior chamber.  A temporal clear-corneal main wound incision was created using a 2.43mm microkeratome.  A continuous curvilinear capsulorrhexis was initiated using an irrigating cystitome and completed using capsulorrhexis forceps.  Hydrodissection and hydrodeliniation were performed.  Viscoelastic was injected into the anterior chamber.  A phacoemulsification handpiece and a chopper as a second instrument were used to remove the nucleus and epinucleus. The irrigation/aspiration handpiece was used to  remove any remaining cortical material.   The capsular bag was reinflated with viscoelastic, checked, and found to be intact.  The intraocular lens was inserted into the capsular bag.  The irrigation/aspiration handpiece was used to remove any remaining viscoelastic.  The clear corneal wound and paracentesis wounds were then hydrated and checked with Weck-Cels to be watertight. 0.20mL of Moxfloxacin was injected into the anterior chamber. The lid-speculum was removed.  The drape was removed.  The patient's face was cleaned with a wet and dry 4x4. A clear shield was taped over the eye. The patient was taken to the post-operative care unit in good condition, having tolerated the procedure well.  Post-Op Instructions: The patient will follow up at [facility] for a same day post-operative evaluation and will receive all other orders and instructions.

## 2023-07-19 NOTE — Transfer of Care (Addendum)
Immediate Anesthesia Transfer of Care Note  Patient: [name]  Procedure(s) Performed: CATARACT EXTRACTION PHACO AND INTRAOCULAR LENS PLACEMENT (IOC) (Right: Eye)  Patient Location: Short Stay  Anesthesia Type:MAC  Level of Consciousness: awake and patient cooperative  Airway & Oxygen Therapy: Patient Spontanous Breathing  Post-op Assessment: Report given to RN and Post -op Vital signs reviewed and stable  Post vital signs: Reviewed and stable  Last Vitals:  Vitals Value Taken Time  BP 137/87 07/19/23   1000  Temp 36.7 07/19/23   1000  Pulse 65 07/19/23   1000  Resp 16 07/19/23   1000  SpO2 98% 07/19/23   1000    Last Pain:  Vitals:   07/19/23 0845  PainSc: 0-No pain         Complications: No notable events documented.

## 2023-07-19 NOTE — Interval H&P Note (Signed)
History and Physical Interval Note:  07/19/2023 9:34 AM  Unk Pinto  has presented today for surgery, with the diagnosis of combined forms age related cataract, right eye.  The various methods of treatment have been discussed with the patient and family. After consideration of risks, benefits and other options for treatment, the patient has consented to  Procedure(s): CATARACT EXTRACTION PHACO AND INTRAOCULAR LENS PLACEMENT (IOC) (Right) as a surgical intervention.  The patient's history has been reviewed, patient examined, no change in status, stable for surgery.  I have reviewed the patient's chart and labs.  Questions were answered to the patient's satisfaction.     Fabio Pierce

## 2023-07-21 ENCOUNTER — Encounter (HOSPITAL_COMMUNITY): Payer: Self-pay | Admitting: Ophthalmology

## 2023-07-26 DIAGNOSIS — H25812 Combined forms of age-related cataract, left eye: Secondary | ICD-10-CM | POA: Diagnosis not present

## 2023-07-28 NOTE — H&P (Signed)
Surgical History & Physical  Patient Name: [name]  DOB: [birth year]  Surgery: Cataract extraction with intraocular lens implant phacoemulsification; Left Eye Surgeon: Fabio Pierce MD Surgery Date: 08/02/2023 Pre-Op Date: 07/26/2023  HPI: A 53 Yr. old female patient 1. The patient is returning for a cataract follow-up of the right eye. Since the last visit, the affected area is doing well. The patient's vision is improved. The condition's severity is constant. Patient is following medication instructions. Patient also here for follow up of a cataract and blurry vision in the left eye. Patient states that she still has a lot of glare in the left eye driving in the dark. Things are also hard to read up close especially in low light. There is a large difference between the two eyes. This is negatively affecting the patient's quality of life and the patient is unable to function adequately in life with the current level of vision. HPI was performed by Fabio Pierce .  Medical History: Dry Eyes Cataracts  High Blood Pressure Allergies  Review of Systems Allergic/Immunologic Seasonal Allergies Cardiovascular High Blood Pressure All recorded systems are negative except as noted above.  Social Never smoked   Medication Moxifloxacin, Ilevro, Prednisolone acetate,  olmesartan ,  amlodipine ,  metoprolol succinate ,  prednisolone acetate ,  Prolensa ,  moxifloxacin   Sx/Procedures Phaco c IOL OD,  Knee sx bilateral, Shoulder Replacement, Appendectomy, Tonsillectomy  Drug Allergies  hydrocodone   History & Physical: Heent: cataract NECK: supple without bruits LUNGS: lungs clear to auscultation CV: regular rate and rhythm Abdomen: soft and non-tender  Impression & Plan: Assessment: 1.  CATARACT EXTRACTION STATUS; Right Eye (Z98.41) 2.  COMBINED FORMS AGE RELATED CATARACT; Left Eye (H25.812)  Plan: 1.  1 week after cataract surgery. Doing well with improved vision and  normal eye pressure. Call with any problems or concerns. Continue Pred-Moxi-Brom 2x/day for 3 more weeks.  2.  Cataract accounts for the patient's decreased vision. This visual impairment is not correctable with a tolerable change in glasses or contact lenses. Cataract surgery with an implantation of a new lens should significantly improve the visual and functional status of the patient. Discussed all risks, benefits, alternatives, and potential complications. Discussed the procedures and recovery. Patient desires to have surgery. A-scan ordered and performed today for intra-ocular lens calculations. The surgery will be performed in order to improve vision for driving, reading, and for eye examinations. Recommend phacoemulsification with intra-ocular lens. Recommend Dextenza for post-operative pain and inflammation. Left Eye. Surgery required to correct imbalance of vision. Dilates well - shugarcaine by protocol.

## 2023-07-29 ENCOUNTER — Encounter (HOSPITAL_COMMUNITY)
Admission: RE | Admit: 2023-07-29 | Discharge: 2023-07-29 | Disposition: A | Discharge status: Home / Self Care | Payer: Medicare HMO | Source: Ambulatory Visit | Attending: Ophthalmology | Admitting: Ophthalmology

## 2023-07-29 ENCOUNTER — Other Ambulatory Visit: Payer: Self-pay

## 2023-07-29 ENCOUNTER — Encounter (HOSPITAL_COMMUNITY): Payer: Self-pay

## 2023-08-02 ENCOUNTER — Ambulatory Visit (HOSPITAL_BASED_OUTPATIENT_CLINIC_OR_DEPARTMENT_OTHER): Payer: Medicare HMO | Admitting: Certified Registered"

## 2023-08-02 ENCOUNTER — Ambulatory Visit (HOSPITAL_COMMUNITY): Payer: Medicare HMO | Admitting: Certified Registered"

## 2023-08-02 ENCOUNTER — Ambulatory Visit (HOSPITAL_COMMUNITY)
Admission: RE | Admit: 2023-08-02 | Discharge: 2023-08-02 | Disposition: A | Discharge status: Home / Self Care | Payer: Medicare HMO | Attending: Ophthalmology | Admitting: Ophthalmology

## 2023-08-02 ENCOUNTER — Encounter (HOSPITAL_COMMUNITY): Payer: Self-pay | Admitting: Ophthalmology

## 2023-08-02 ENCOUNTER — Encounter (HOSPITAL_COMMUNITY)
Admission: RE | Disposition: A | Discharge status: Home / Self Care | Payer: Self-pay | Source: Home / Self Care | Attending: Ophthalmology

## 2023-08-02 DIAGNOSIS — I10 Essential (primary) hypertension: Secondary | ICD-10-CM | POA: Diagnosis not present

## 2023-08-02 DIAGNOSIS — H25812 Combined forms of age-related cataract, left eye: Secondary | ICD-10-CM | POA: Diagnosis not present

## 2023-08-02 DIAGNOSIS — M1990 Unspecified osteoarthritis, unspecified site: Secondary | ICD-10-CM | POA: Diagnosis not present

## 2023-08-02 DIAGNOSIS — Z87891 Personal history of nicotine dependence: Secondary | ICD-10-CM | POA: Diagnosis not present

## 2023-08-02 HISTORY — PX: CATARACT EXTRACTION W/PHACO: SHX586

## 2023-08-02 SURGERY — PHACOEMULSIFICATION, CATARACT, WITH IOL INSERTION
Anesthesia: Monitor Anesthesia Care | Site: Eye | Laterality: Left

## 2023-08-02 MED ORDER — SODIUM HYALURONATE 10 MG/ML IO SOLUTION
PREFILLED_SYRINGE | INTRAOCULAR | Status: DC | PRN
  Administered 2023-08-02: .85 mL via INTRAOCULAR

## 2023-08-02 MED ORDER — BSS IO SOLN
INTRAOCULAR | Status: DC | PRN
  Administered 2023-08-02: 15 mL via INTRAOCULAR

## 2023-08-02 MED ORDER — LIDOCAINE HCL 3.5 % OP GEL
1.0000 | Freq: Once | OPHTHALMIC | Status: AC
  Administered 2023-08-02: 1 via OPHTHALMIC

## 2023-08-02 MED ORDER — MIDAZOLAM HCL 2 MG/2ML IJ SOLN
INTRAMUSCULAR | Status: DC | PRN
  Administered 2023-08-02: 2 mg via INTRAVENOUS

## 2023-08-02 MED ORDER — SODIUM CHLORIDE 0.9% FLUSH
INTRAVENOUS | Status: DC | PRN
  Administered 2023-08-02: 5 mL via INTRAVENOUS

## 2023-08-02 MED ORDER — STERILE WATER FOR IRRIGATION IR SOLN
Status: DC | PRN
  Administered 2023-08-02: 1

## 2023-08-02 MED ORDER — MIDAZOLAM HCL 2 MG/2ML IJ SOLN
INTRAMUSCULAR | Status: AC
  Filled 2023-08-02: qty 2

## 2023-08-02 MED ORDER — LIDOCAINE HCL (PF) 1 % IJ SOLN
INTRAOCULAR | Status: DC | PRN
  Administered 2023-08-02: 1 mL via OPHTHALMIC

## 2023-08-02 MED ORDER — SODIUM HYALURONATE 23MG/ML IO SOSY
PREFILLED_SYRINGE | INTRAOCULAR | Status: DC | PRN
  Administered 2023-08-02: .6 mL via INTRAOCULAR

## 2023-08-02 MED ORDER — TETRACAINE HCL 0.5 % OP SOLN
1.0000 [drp] | OPHTHALMIC | Status: AC | PRN
  Administered 2023-08-02 (×3): 1 [drp] via OPHTHALMIC

## 2023-08-02 MED ORDER — POVIDONE-IODINE 5 % OP SOLN
OPHTHALMIC | Status: DC | PRN
  Administered 2023-08-02: 1 via OPHTHALMIC

## 2023-08-02 MED ORDER — PHENYLEPHRINE HCL 2.5 % OP SOLN
1.0000 [drp] | OPHTHALMIC | Status: AC | PRN
  Administered 2023-08-02 (×3): 1 [drp] via OPHTHALMIC

## 2023-08-02 MED ORDER — PHENYLEPHRINE-KETOROLAC 1-0.3 % IO SOLN
INTRAOCULAR | Status: DC | PRN
  Administered 2023-08-02: 500 mL via OPHTHALMIC

## 2023-08-02 MED ORDER — MOXIFLOXACIN HCL 5 MG/ML IO SOLN
INTRAOCULAR | Status: DC | PRN
  Administered 2023-08-02: .3 mL via INTRACAMERAL

## 2023-08-02 MED ORDER — TROPICAMIDE 1 % OP SOLN
1.0000 [drp] | OPHTHALMIC | Status: AC | PRN
Start: 2023-08-02 — End: 2023-08-02
  Administered 2023-08-02 (×3): 1 [drp] via OPHTHALMIC

## 2023-08-02 SURGICAL SUPPLY — 15 items
CATARACT SUITE SIGHTPATH (MISCELLANEOUS) ×1
CLOTH BEACON ORANGE TIMEOUT ST (SAFETY) ×2 IMPLANT
EYE SHIELD UNIVERSAL CLEAR (GAUZE/BANDAGES/DRESSINGS) IMPLANT
FEE CATARACT SUITE SIGHTPATH (MISCELLANEOUS) ×2 IMPLANT
GLOVE BIOGEL PI IND STRL 7.0 (GLOVE) ×4 IMPLANT
GOWN STRL REUS W/TWL LRG LVL3 (GOWN DISPOSABLE) IMPLANT
LENS IOL TECNIS EYHANCE 17.5 (Intraocular Lens) IMPLANT
NDL HYPO 18GX1.5 BLUNT FILL (NEEDLE) ×2 IMPLANT
NEEDLE HYPO 18GX1.5 BLUNT FILL (NEEDLE) ×1
PAD ARMBOARD 7.5X6 YLW CONV (MISCELLANEOUS) ×2 IMPLANT
POSITIONER HEAD 8X9X4 ADT (SOFTGOODS) ×2 IMPLANT
SYR TB 1ML LL NO SAFETY (SYRINGE) ×2 IMPLANT
TAPE PAPER 2X10 WHT MICROPORE (GAUZE/BANDAGES/DRESSINGS) IMPLANT
TAPE SURG TRANSPORE 1 IN (GAUZE/BANDAGES/DRESSINGS) IMPLANT
WATER STERILE IRR 250ML POUR (IV SOLUTION) ×2 IMPLANT

## 2023-08-02 NOTE — Transfer of Care (Signed)
Immediate Anesthesia Transfer of Care Note  Patient: [name]  Procedure(s) Performed: CATARACT EXTRACTION PHACO AND INTRAOCULAR LENS PLACEMENT (IOC) (Left: Eye)  Patient Location: Short Stay  Anesthesia Type:MAC  Level of Consciousness: awake, alert , and oriented  Airway & Oxygen Therapy: Patient Spontanous Breathing  Post-op Assessment: Report given to RN and Post -op Vital signs reviewed and stable  Post vital signs: Reviewed and stable  Last Vitals:  Vitals Value Taken Time  BP    Temp    Pulse    Resp    SpO2      Last Pain:  Vitals:   08/02/23 1251  PainSc: 0-No pain         Complications: No notable events documented.

## 2023-08-02 NOTE — Anesthesia Procedure Notes (Addendum)
Procedure Name: MAC Date/Time: 08/02/2023 1:31 PM  Performed by: Julian Reil, CRNAPre-anesthesia Checklist: Patient identified, Emergency Drugs available, Suction available and Patient being monitored Patient Re-evaluated:Patient Re-evaluated prior to induction Oxygen Delivery Method: Nasal cannula Placement Confirmation: positive ETCO2

## 2023-08-02 NOTE — Op Note (Signed)
Date of procedure: 08/02/23  Pre-operative diagnosis: Visually significant age-related combined cataract, Left Eye (H25.812)  Post-operative diagnosis: Visually significant age-related combined cataract, Left Eye (H25.812)  Procedure: Removal of cataract via phacoemulsification and insertion of intra-ocular lens Laural Benes and Johnson DIB00 +21.5D into the capsular bag of the Left Eye  Attending surgeon: Rudy Jew. Kadejah Sandiford, MD, MA  Anesthesia: MAC, Topical Akten  Complications: None  Estimated Blood Loss: <21mL (minimal)  Specimens: None  Implants: As above  Indications:  Visually significant age-related cataract, Left Eye  Procedure:  The patient was seen and identified in the pre-operative area. The operative eye was identified and dilated.  The operative eye was marked.  Topical anesthesia was administered to the operative eye.     The patient was then to the operative suite and placed in the supine position.  A timeout was performed confirming the patient, procedure to be performed, and all other relevant information.   The patient's face was prepped and draped in the usual fashion for intra-ocular surgery.  A lid speculum was placed into the operative eye and the surgical microscope moved into place and focused.  An inferotemporal paracentesis was created using a 20 gauge paracentesis blade.  Shugarcaine was injected into the anterior chamber.  Viscoelastic was injected into the anterior chamber.  A temporal clear-corneal main wound incision was created using a 2.40mm microkeratome.  A continuous curvilinear capsulorrhexis was initiated using an irrigating cystitome and completed using capsulorrhexis forceps.  Hydrodissection and hydrodeliniation were performed.  Viscoelastic was injected into the anterior chamber.  A phacoemulsification handpiece and a chopper as a second instrument were used to remove the nucleus and epinucleus. The irrigation/aspiration handpiece was used to remove any  remaining cortical material.   The capsular bag was reinflated with viscoelastic, checked, and found to be intact.  The intraocular lens was inserted into the capsular bag.  The irrigation/aspiration handpiece was used to remove any remaining viscoelastic.  The clear corneal wound and paracentesis wounds were then hydrated and checked with Weck-Cels to be watertight. 0.20mL of Moxfloxacin was injected into the anterior chamber. The lid-speculum was removed.  The drape was removed.  The patient's face was cleaned with a wet and dry 4x4.    A clear shield was taped over the eye. The patient was taken to the post-operative care unit in good condition, having tolerated the procedure well.  Post-Op Instructions: The patient will follow up at [facility] for a same day post-operative evaluation and will receive all other orders and instructions.

## 2023-08-02 NOTE — Anesthesia Preprocedure Evaluation (Signed)
Anesthesia Evaluation  Patient identified by MRN, date of birth, ID band Patient awake    Reviewed: Allergy & Precautions, H&P , NPO status , Patient's Chart, lab work & pertinent test results, reviewed documented beta blocker date and time   History of Anesthesia Complications (+) PONV and history of anesthetic complications  Airway Mallampati: II  TM Distance: >3 FB Neck ROM: full    Dental  (+) Dental Advisory Given, Caps Upper front teeth capped:   Pulmonary former smoker   Pulmonary exam normal breath sounds clear to auscultation       Cardiovascular Exercise Tolerance: Good hypertension, Normal cardiovascular exam Rhythm:regular Rate:Normal     Neuro/Psych negative neurological ROS  negative psych ROS   GI/Hepatic negative GI ROS, Neg liver ROS,,,  Endo/Other  negative endocrine ROS    Renal/GU negative Renal ROS  negative genitourinary   Musculoskeletal  (+) Arthritis , Osteoarthritis,    Abdominal Normal abdominal exam  (+)   Peds  Hematology negative hematology ROS (+)   Anesthesia Other Findings   Reproductive/Obstetrics negative OB ROS                             Anesthesia Physical Anesthesia Plan  ASA: 2  Anesthesia Plan: MAC   Post-op Pain Management: Minimal or no pain anticipated   Induction:   PONV Risk Score and Plan:   Airway Management Planned: Nasal Cannula and Natural Airway  Additional Equipment: None  Intra-op Plan:   Post-operative Plan:   Informed Consent: I have reviewed the patients History and Physical, chart, labs and discussed the procedure including the risks, benefits and alternatives for the proposed anesthesia with the patient or authorized representative who has indicated his/her understanding and acceptance.     Dental Advisory Given  Plan Discussed with: CRNA  Anesthesia Plan Comments:         Anesthesia Quick  Evaluation

## 2023-08-02 NOTE — Interval H&P Note (Signed)
History and Physical Interval Note:  08/02/2023 1:21 PM  [name]  has presented today for surgery, with the diagnosis of combined form age related cataract, left eye.  The various methods of treatment have been discussed with the patient and family. After consideration of risks, benefits and other options for treatment, the patient has consented to  Procedure(s) with comments: CATARACT EXTRACTION PHACO AND INTRAOCULAR LENS PLACEMENT (IOC) (Left) - CDE: as a surgical intervention.  The patient's history has been reviewed, patient examined, no change in status, stable for surgery.  I have reviewed the patient's chart and labs.  Questions were answered to the patient's satisfaction.     Fabio Pierce

## 2023-08-02 NOTE — Discharge Instructions (Addendum)
Please discharge patient when stable, will follow up today with Dr. Carnella Fryman at the [facility] office immediately following discharge.  Leave shield in place until visit.  All paperwork with discharge instructions will be given at the office.  [facility] Address:  [address]  [place], [place] [address]  

## 2023-08-02 NOTE — Anesthesia Postprocedure Evaluation (Signed)
Anesthesia Post Note  Patient: Chastelin Marsicano  Procedure(s) Performed: CATARACT EXTRACTION PHACO AND INTRAOCULAR LENS PLACEMENT (IOC) (Left: Eye)  Patient location during evaluation: PACU Anesthesia Type: MAC Level of consciousness: awake and alert Pain management: pain level controlled Vital Signs Assessment: post-procedure vital signs reviewed and stable Respiratory status: spontaneous breathing, nonlabored ventilation, respiratory function stable and patient connected to nasal cannula oxygen Cardiovascular status: stable and blood pressure returned to baseline Postop Assessment: no apparent nausea or vomiting Anesthetic complications: no   There were no known notable events for this encounter.   Last Vitals:  Vitals:   08/02/23 1255 08/02/23 1351  BP: (!) 118/57 96/84  Pulse: 61 66  Resp: 16 14  Temp: 36.6 C 36.9 C  SpO2: 98% 96%    Last Pain:  Vitals:   08/02/23 1351  TempSrc: Oral  PainSc: 0-No pain                 Damontay Alred L Cathan Gearin

## 2023-08-04 ENCOUNTER — Encounter (HOSPITAL_COMMUNITY): Payer: Self-pay | Admitting: Ophthalmology

## 2023-08-25 DIAGNOSIS — D692 Other nonthrombocytopenic purpura: Secondary | ICD-10-CM | POA: Diagnosis not present

## 2023-08-25 DIAGNOSIS — L814 Other melanin hyperpigmentation: Secondary | ICD-10-CM | POA: Diagnosis not present

## 2023-11-09 DIAGNOSIS — L570 Actinic keratosis: Secondary | ICD-10-CM | POA: Diagnosis not present

## 2023-11-09 DIAGNOSIS — L821 Other seborrheic keratosis: Secondary | ICD-10-CM | POA: Diagnosis not present

## 2023-11-09 DIAGNOSIS — D1801 Hemangioma of skin and subcutaneous tissue: Secondary | ICD-10-CM | POA: Diagnosis not present

## 2023-11-09 DIAGNOSIS — L814 Other melanin hyperpigmentation: Secondary | ICD-10-CM | POA: Diagnosis not present

## 2023-12-06 DIAGNOSIS — R5383 Other fatigue: Secondary | ICD-10-CM | POA: Diagnosis not present

## 2023-12-06 DIAGNOSIS — E782 Mixed hyperlipidemia: Secondary | ICD-10-CM | POA: Diagnosis not present

## 2023-12-06 DIAGNOSIS — N183 Chronic kidney disease, stage 3 unspecified: Secondary | ICD-10-CM | POA: Diagnosis not present

## 2023-12-06 DIAGNOSIS — N1831 Chronic kidney disease, stage 3a: Secondary | ICD-10-CM | POA: Diagnosis not present

## 2023-12-06 DIAGNOSIS — N1832 Chronic kidney disease, stage 3b: Secondary | ICD-10-CM | POA: Diagnosis not present

## 2023-12-10 DIAGNOSIS — R300 Dysuria: Secondary | ICD-10-CM | POA: Diagnosis not present

## 2023-12-17 DIAGNOSIS — Z23 Encounter for immunization: Secondary | ICD-10-CM | POA: Diagnosis not present

## 2024-04-04 DIAGNOSIS — D3132 Benign neoplasm of left choroid: Secondary | ICD-10-CM | POA: Diagnosis not present

## 2024-04-18 DIAGNOSIS — H52223 Regular astigmatism, bilateral: Secondary | ICD-10-CM | POA: Diagnosis not present

## 2024-04-18 DIAGNOSIS — H524 Presbyopia: Secondary | ICD-10-CM | POA: Diagnosis not present

## 2024-05-03 ENCOUNTER — Other Ambulatory Visit (HOSPITAL_COMMUNITY): Payer: Self-pay | Admitting: Family Medicine

## 2024-05-03 DIAGNOSIS — Z1231 Encounter for screening mammogram for malignant neoplasm of breast: Secondary | ICD-10-CM

## 2024-05-19 ENCOUNTER — Ambulatory Visit (HOSPITAL_COMMUNITY)
Admission: RE | Admit: 2024-05-19 | Discharge: 2024-05-19 | Disposition: A | Discharge status: Home / Self Care | Source: Ambulatory Visit | Attending: Family Medicine | Admitting: Family Medicine

## 2024-05-19 DIAGNOSIS — Z1231 Encounter for screening mammogram for malignant neoplasm of breast: Secondary | ICD-10-CM | POA: Insufficient documentation
